# Patient Record
Sex: Male | Born: 1950
Health system: Southern US, Community
[De-identification: ages and names within clinical notes are randomized; demographics above are authoritative.]

## PROBLEM LIST (undated history)

## (undated) DIAGNOSIS — Z972 Presence of dental prosthetic device (complete) (partial): Secondary | ICD-10-CM

## (undated) DIAGNOSIS — I10 Essential (primary) hypertension: Secondary | ICD-10-CM

## (undated) DIAGNOSIS — M109 Gout, unspecified: Secondary | ICD-10-CM

## (undated) DIAGNOSIS — E119 Type 2 diabetes mellitus without complications: Secondary | ICD-10-CM

## (undated) DIAGNOSIS — E785 Hyperlipidemia, unspecified: Secondary | ICD-10-CM

## (undated) HISTORY — DX: Gout, unspecified: M10.9

## (undated) HISTORY — DX: Hyperlipidemia, unspecified: E78.5

## (undated) HISTORY — DX: Essential (primary) hypertension: I10

## (undated) HISTORY — PX: EYE SURGERY: SHX253

## (undated) HISTORY — PX: OTHER SURGICAL HISTORY: SHX169

## (undated) HISTORY — PX: JOINT REPLACEMENT: SHX530

---

## 2011-05-19 ENCOUNTER — Emergency Department: Payer: Self-pay | Admitting: Emergency Medicine

## 2011-11-14 ENCOUNTER — Ambulatory Visit: Payer: Self-pay | Admitting: Emergency Medicine

## 2012-06-20 LAB — HM COLONOSCOPY: HM Colonoscopy: NORMAL

## 2012-08-20 ENCOUNTER — Encounter: Payer: Self-pay | Admitting: Family Medicine

## 2012-08-20 ENCOUNTER — Ambulatory Visit (INDEPENDENT_AMBULATORY_CARE_PROVIDER_SITE_OTHER): Payer: PRIVATE HEALTH INSURANCE | Admitting: Family Medicine

## 2012-08-20 VITALS — BP 162/92 | HR 84 | Temp 98.1°F | Ht 67.5 in | Wt 186.0 lb

## 2012-08-20 DIAGNOSIS — Z125 Encounter for screening for malignant neoplasm of prostate: Secondary | ICD-10-CM

## 2012-08-20 DIAGNOSIS — M109 Gout, unspecified: Secondary | ICD-10-CM | POA: Insufficient documentation

## 2012-08-20 DIAGNOSIS — E785 Hyperlipidemia, unspecified: Secondary | ICD-10-CM

## 2012-08-20 DIAGNOSIS — I10 Essential (primary) hypertension: Secondary | ICD-10-CM

## 2012-08-20 LAB — COMPREHENSIVE METABOLIC PANEL
ALT: 55 U/L — ABNORMAL HIGH (ref 0–53)
AST: 44 U/L — ABNORMAL HIGH (ref 0–37)
Albumin: 4.3 g/dL (ref 3.5–5.2)
CO2: 29 mEq/L (ref 19–32)
Calcium: 9.9 mg/dL (ref 8.4–10.5)
Chloride: 99 mEq/L (ref 96–112)
Creatinine, Ser: 1 mg/dL (ref 0.4–1.5)
GFR: 85.58 mL/min (ref >60.00)
Potassium: 4.4 mEq/L (ref 3.5–5.1)

## 2012-08-20 LAB — PSA: PSA: 1.29 ng/mL (ref 0.10–4.00)

## 2012-08-20 LAB — LIPID PANEL: HDL: 55.4 mg/dL (ref >39.00)

## 2012-08-20 MED ORDER — LISINOPRIL-HYDROCHLOROTHIAZIDE 20-12.5 MG PO TABS: 1.0000 | ORAL_TABLET | Freq: Every day | ORAL | Status: DC

## 2012-08-20 MED ORDER — ALLOPURINOL 300 MG PO TABS: ORAL_TABLET | ORAL | Status: DC

## 2012-08-20 MED ORDER — SILDENAFIL CITRATE 50 MG PO TABS: 50.0000 mg | ORAL_TABLET | Freq: Every day | ORAL | Status: DC | PRN

## 2012-08-20 NOTE — Progress Notes (Signed)
Subjective:    Patient ID: David Turner, male    DOB: Oct 03, 1950, 61 y.o.   MRN: [id number]  HPI  61 yo here to establish care.  HTN- on lisinopril- HCTZ for years.  Checks BP at home and this am was 130/80.  Elevated here today but rushed to get here. No CP, SOB or blurred vision.  Gout- last flare was in right great toe years ago.  Has been on allopurinol since.  H/o HLD- was on lipitor previously.  Has not had cholesterol checked in awhile.  Never had a flu shot.  Colonoscopy normal this year.  Patient Active Problem List  Diagnosis  . Gout  . Hypertension  . Hyperlipidemia   Past Medical History  Diagnosis Date  . Gout   . Hypertension   . Hyperlipidemia    Past Surgical History  Procedure Date  . Left knee surgeries 1983 1993, 2008   History  Substance Use Topics  . Smoking status: Former Games developer  . Smokeless tobacco: Not on file  . Alcohol Use: Not on file   Family History  Problem Relation Age of Onset  . Heart disease Father   . Cancer Sister 6    colon   No Known Allergies Current Outpatient Prescriptions on File Prior to Visit  Medication Sig Dispense Refill  . allopurinol (ZYLOPRIM) 300 MG tablet Take by mouth daily.  90 tablet  3  . lisinopril-hydrochlorothiazide (PRINZIDE,ZESTORETIC) 20-12.5 MG per tablet Take 1 tablet by mouth daily.  90 tablet  6  . sildenafil (VIAGRA) 50 MG tablet Take 1 tablet (50 mg total) by mouth daily as needed for erectile dysfunction.  10 tablet  0   The PMH, PSH, Social History, Family History, Medications, and allergies have been reviewed in [facility], and have been updated if relevant.    Review of Systems See HPI Patient reports no  vision/ hearing changes,anorexia, weight change, fever ,adenopathy, persistant / recurrent hoarseness, swallowing issues, chest pain, edema,persistant / recurrent cough, hemoptysis, dyspnea(rest, exertional, paroxysmal nocturnal), gastrointestinal  bleeding (melena, rectal bleeding),  abdominal pain, excessive heart burn, GU symptoms(dysuria, hematuria, pyuria, voiding/incontinence  Issues) syncope, focal weakness, severe memory loss, concerning skin lesions, depression, anxiety, abnormal bruising/bleeding, major joint swelling.       Objective:   Physical Exam BP 162/92  Pulse 84  Temp 98.1 F (36.7 C)  Ht 5' 7.5" (1.715 m)  Wt 186 lb (84.369 kg)  BMI 28.70 kg/m2 General:  pleasant male in NAD Eyes:  PERRL Ears:  External ear exam shows no significant lesions or deformities.  Otoscopic examination reveals clear canals, tympanic membranes are intact bilaterally without bulging, retraction, inflammation or discharge. Hearing is grossly normal bilaterally. Nose:  External nasal examination shows no deformity or inflammation. Nasal mucosa are pink and moist without lesions or exudates. Mouth:  Oral mucosa and oropharynx without lesions or exudates.  Teeth in good repair. Neck:  no carotid bruit or thyromegaly no cervical or supraclavicular lymphadenopathy  Lungs:  Normal respiratory effort, chest expands symmetrically. Lungs are clear to auscultation, no crackles or wheezes. Heart:  Normal rate and regular rhythm. S1 and S2 normal without gallop, murmur, click, rub or other extra sounds. Abdomen:  Bowel sounds positive,abdomen soft and non-tender without masses, organomegaly or hernias noted. Genitalia:  Testes bilaterally descended without nodularity, tenderness or masses. No scrotal masses or lesions. No penis lesions or urethral discharge. Prostate:  Prostate gland firm and smooth, 1 plus enlargement, no nodularity, tenderness, mass, asymmetry or induration. Pulses:  R and L posterior tibial pulses are full and equal bilaterally  Extremities:  no edema         Assessment & Plan:   1. Hyperlipidemia  Does have risk factors for CAD- mother with CAD, h/o HTN. Former smoker. Will check lipid panel today. Lipid Panel  2. Hypertension  Deteriorated likely due to  white coat HTN. Stable at home on current meds- meds refilled. Recheck CMET today. Comprehensive metabolic panel  3. Gout  Stable on allopurinol.   4. Screening for prostate cancer  PSA

## 2012-08-20 NOTE — Patient Instructions (Addendum)
Great to meet you. We will call you with your lab results and to pick up your records.

## 2012-08-21 ENCOUNTER — Other Ambulatory Visit: Payer: Self-pay | Admitting: Family Medicine

## 2012-08-21 MED ORDER — ATORVASTATIN CALCIUM 20 MG PO TABS: 20.0000 mg | ORAL_TABLET | Freq: Every day | ORAL | Status: DC

## 2012-10-16 ENCOUNTER — Other Ambulatory Visit: Payer: Self-pay | Admitting: Family Medicine

## 2012-10-17 ENCOUNTER — Other Ambulatory Visit (INDEPENDENT_AMBULATORY_CARE_PROVIDER_SITE_OTHER): Payer: Managed Care, Other (non HMO)

## 2012-10-17 ENCOUNTER — Encounter: Payer: Self-pay | Admitting: *Deleted

## 2012-10-17 DIAGNOSIS — E785 Hyperlipidemia, unspecified: Secondary | ICD-10-CM

## 2012-10-17 DIAGNOSIS — I10 Essential (primary) hypertension: Secondary | ICD-10-CM

## 2012-10-17 LAB — LIPID PANEL
Cholesterol: 139 mg/dL (ref 0–200)
LDL Cholesterol: 67 mg/dL (ref 0–99)
Total CHOL/HDL Ratio: 2
VLDL: 15 mg/dL (ref 0.0–40.0)

## 2012-10-17 LAB — COMPREHENSIVE METABOLIC PANEL
ALT: 57 U/L — ABNORMAL HIGH (ref 0–53)
AST: 49 U/L — ABNORMAL HIGH (ref 0–37)
Albumin: 4.1 g/dL (ref 3.5–5.2)
Alkaline Phosphatase: 86 U/L (ref 39–117)
BUN: 13 mg/dL (ref 6–23)
Potassium: 4.4 mEq/L (ref 3.5–5.1)
Sodium: 138 mEq/L (ref 135–145)

## 2013-06-13 ENCOUNTER — Other Ambulatory Visit: Payer: Self-pay | Admitting: Family Medicine

## 2013-08-14 ENCOUNTER — Other Ambulatory Visit: Payer: Self-pay | Admitting: Family Medicine

## 2013-08-22 ENCOUNTER — Other Ambulatory Visit: Payer: Self-pay | Admitting: Family Medicine

## 2013-11-05 ENCOUNTER — Encounter: Payer: Self-pay | Admitting: Family Medicine

## 2013-11-05 ENCOUNTER — Ambulatory Visit (INDEPENDENT_AMBULATORY_CARE_PROVIDER_SITE_OTHER): Payer: Managed Care, Other (non HMO) | Admitting: Family Medicine

## 2013-11-05 VITALS — BP 128/76 | HR 74 | Temp 98.1°F | Ht 67.0 in | Wt 186.5 lb

## 2013-11-05 DIAGNOSIS — Z0000 Encounter for general adult medical examination without abnormal findings: Secondary | ICD-10-CM

## 2013-11-05 DIAGNOSIS — I10 Essential (primary) hypertension: Secondary | ICD-10-CM

## 2013-11-05 DIAGNOSIS — E785 Hyperlipidemia, unspecified: Secondary | ICD-10-CM

## 2013-11-05 DIAGNOSIS — Z125 Encounter for screening for malignant neoplasm of prostate: Secondary | ICD-10-CM

## 2013-11-05 DIAGNOSIS — M109 Gout, unspecified: Secondary | ICD-10-CM

## 2013-11-05 DIAGNOSIS — Z23 Encounter for immunization: Secondary | ICD-10-CM

## 2013-11-05 LAB — CBC WITH DIFFERENTIAL/PLATELET
BASOS PCT: 0.4 % (ref 0.0–3.0)
Basophils Absolute: 0 10*3/uL (ref 0.0–0.1)
EOS PCT: 1.9 % (ref 0.0–5.0)
Eosinophils Absolute: 0.1 10*3/uL (ref 0.0–0.7)
HEMATOCRIT: 47 % (ref 39.0–52.0)
Hemoglobin: 15.3 g/dL (ref 13.0–17.0)
LYMPHS ABS: 2.5 10*3/uL (ref 0.7–4.0)
Lymphocytes Relative: 36.6 % (ref 12.0–46.0)
MCHC: 32.5 g/dL (ref 30.0–36.0)
MCV: 87.1 fl (ref 78.0–100.0)
MONO ABS: 0.6 10*3/uL (ref 0.1–1.0)
Monocytes Relative: 8.4 % (ref 3.0–12.0)
Neutro Abs: 3.5 10*3/uL (ref 1.4–7.7)
Neutrophils Relative %: 52.7 % (ref 43.0–77.0)
PLATELETS: 272 10*3/uL (ref 150.0–400.0)
RBC: 5.4 Mil/uL (ref 4.22–5.81)
RDW: 14.9 % — ABNORMAL HIGH (ref 11.5–14.6)
WBC: 6.7 10*3/uL (ref 4.5–10.5)

## 2013-11-05 LAB — COMPREHENSIVE METABOLIC PANEL
ALT: 29 U/L (ref 0–53)
AST: 31 U/L (ref 0–37)
Albumin: 4 g/dL (ref 3.5–5.2)
Alkaline Phosphatase: 80 U/L (ref 39–117)
BUN: 15 mg/dL (ref 6–23)
CALCIUM: 9.8 mg/dL (ref 8.4–10.5)
CO2: 29 meq/L (ref 19–32)
CREATININE: 0.9 mg/dL (ref 0.4–1.5)
Chloride: 100 mEq/L (ref 96–112)
GFR: 90.73 mL/min (ref >60.00)
GLUCOSE: 108 mg/dL — AB (ref 70–99)
Potassium: 4.4 mEq/L (ref 3.5–5.1)
Sodium: 136 mEq/L (ref 135–145)
Total Bilirubin: 0.6 mg/dL (ref 0.3–1.2)
Total Protein: 7.3 g/dL (ref 6.0–8.3)

## 2013-11-05 LAB — LIPID PANEL
Cholesterol: 224 mg/dL — ABNORMAL HIGH (ref 0–200)
HDL: 61.1 mg/dL (ref >39.00)
Total CHOL/HDL Ratio: 4
Triglycerides: 93 mg/dL (ref 0.0–149.0)
VLDL: 18.6 mg/dL (ref 0.0–40.0)

## 2013-11-05 LAB — PSA: PSA: 1.43 ng/mL (ref 0.10–4.00)

## 2013-11-05 LAB — LDL CHOLESTEROL, DIRECT: Direct LDL: 141.6 mg/dL

## 2013-11-05 MED ORDER — LISINOPRIL-HYDROCHLOROTHIAZIDE 20-12.5 MG PO TABS: 1.0000 | ORAL_TABLET | Freq: Every day | ORAL | Status: DC

## 2013-11-05 MED ORDER — ALLOPURINOL 300 MG PO TABS: 300.0000 mg | ORAL_TABLET | Freq: Every day | ORAL | Status: DC

## 2013-11-05 MED ORDER — SILDENAFIL CITRATE 50 MG PO TABS: 50.0000 mg | ORAL_TABLET | ORAL | Status: DC | PRN

## 2013-11-05 NOTE — Assessment & Plan Note (Signed)
Does have risk factors for CAD- mother with CAD, h/o HTN. Former smoker. Will recheck labs today but likely does need to restart his statin.

## 2013-11-05 NOTE — Assessment & Plan Note (Signed)
Well controlled on current rx. No changes. 

## 2013-11-05 NOTE — Patient Instructions (Signed)
Let's check your labs today- we will call you with your results.

## 2013-11-05 NOTE — Addendum Note (Signed)
Addended by: Modena Nunnery on: 11/05/2013 10:28 AM   Modules accepted: Orders

## 2013-11-05 NOTE — Progress Notes (Signed)
Pre visit review using our clinic review tool, if applicable. No additional management support is needed unless otherwise documented below in the visit note. 

## 2013-11-05 NOTE — Progress Notes (Signed)
Subjective:    Patient ID: Calogero Geisen, male    DOB: [birth year], 63 y.o.   MRN: [id number]  HPI  63 yo here for med refill.  Has not been seen for routine care since 08/2012.  HTN- on lisinopril- HCTZ for years.  No CP, SOB or blurred vision.  Gout- last flare was in right great toe years ago.  Has been on allopurinol since.  H/o HLD- was on lipitor 20 mg daily but stopped taking it.  Started taking "something natural instead."  Has not had cholesterol checked since last year.  Lab Results  Component Value Date   CHOL 139 10/17/2012   HDL 57.50 10/17/2012   LDLCALC 67 10/17/2012   LDLDIRECT 160.6 08/20/2012   TRIG 75.0 10/17/2012   CHOLHDL 2 10/17/2012    Never had a flu shot.  He is interested in getting his shingles vaccine.  Colonoscopy UTD.  Patient Active Problem List   Diagnosis Date Noted  . Gout   . Hypertension   . Hyperlipidemia    Past Medical History  Diagnosis Date  . Gout   . Hypertension   . Hyperlipidemia    Past Surgical History  Procedure Laterality Date  . Left knee surgeries  1983 1993, 2008   History  Substance Use Topics  . Smoking status: Former Research scientist (life sciences)  . Smokeless tobacco: Not on file  . Alcohol Use: Not on file   Family History  Problem Relation Age of Onset  . Heart disease Father   . Cancer Sister 98    colon   No Known Allergies Current Outpatient Prescriptions on File Prior to Visit  Medication Sig Dispense Refill  . aspirin 81 MG tablet Take 81 mg by mouth daily.      Marland Kitchen atorvastatin (LIPITOR) 20 MG tablet TAKE 1 TABLET BY MOUTH EVERY DAY  30 tablet  0  . Multiple Vitamin (MULTIVITAMIN) tablet Take 1 tablet by mouth daily.      . Omega-3 Fatty Acids (FISH OIL) 1000 MG CAPS Take by mouth.       No current facility-administered medications on file prior to visit.   The PMH, PSH, Social History, Family History, Medications, and allergies have been reviewed in [facility], and have been updated if relevant.    Review of Systems See HPI        Objective:   Physical Exam BP 128/76  Pulse 74  Temp(Src) 98.1 F (36.7 C) (Oral)  Ht 5\' 7"  (1.702 m)  Wt 186 lb 8 oz (84.596 kg)  BMI 29.20 kg/m2  SpO2 97%  General:  pleasant male in NAD Eyes:  PERRL Ears:  External ear exam shows no significant lesions or deformities.  Otoscopic examination reveals clear canals, tympanic membranes are intact bilaterally without bulging, retraction, inflammation or discharge. Hearing is grossly normal bilaterally. Nose:  External nasal examination shows no deformity or inflammation. Nasal mucosa are pink and moist without lesions or exudates. Mouth:  Oral mucosa and oropharynx without lesions or exudates.  Teeth in good repair. Neck:  no carotid bruit or thyromegaly no cervical or supraclavicular lymphadenopathy  Lungs:  Normal respiratory effort, chest expands symmetrically. Lungs are clear to auscultation, no crackles or wheezes. Heart:  Normal rate and regular rhythm. S1 and S2 normal without gallop, murmur, click, rub or other extra sounds. Abdomen:  Bowel sounds positive,abdomen soft and non-tender without masses, organomegaly or hernias noted. Genitalia:  Testes bilaterally descended without nodularity, tenderness or masses. No scrotal masses or lesions. No penis lesions  or urethral discharge. Prostate:  Prostate gland firm and smooth, 1 plus enlargement, no nodularity, tenderness, mass, asymmetry or induration. Pulses:  R and L posterior tibial pulses are full and equal bilaterally  Extremities:  no edema         Assessment & Plan:

## 2013-11-07 ENCOUNTER — Telehealth: Payer: Self-pay | Admitting: Family Medicine

## 2013-11-07 ENCOUNTER — Encounter: Payer: Self-pay | Admitting: *Deleted

## 2013-11-07 NOTE — Telephone Encounter (Signed)
Relevant patient education assigned to patient using Emmi. ° °

## 2013-11-12 ENCOUNTER — Other Ambulatory Visit: Payer: Self-pay

## 2013-11-12 MED ORDER — ALLOPURINOL 300 MG PO TABS: 300.0000 mg | ORAL_TABLET | Freq: Every day | ORAL | Status: DC

## 2013-11-12 MED ORDER — LISINOPRIL-HYDROCHLOROTHIAZIDE 20-12.5 MG PO TABS: 1.0000 | ORAL_TABLET | Freq: Every day | ORAL | Status: DC

## 2013-11-12 NOTE — Telephone Encounter (Signed)
Pt was recently seen but did not get 90 day rx with refills to [facility]. Advised pt done.

## 2014-06-15 ENCOUNTER — Ambulatory Visit: Payer: Self-pay | Admitting: Ophthalmology

## 2014-06-16 ENCOUNTER — Other Ambulatory Visit: Payer: Self-pay | Admitting: *Deleted

## 2014-06-16 MED ORDER — ALLOPURINOL 300 MG PO TABS: 300.0000 mg | ORAL_TABLET | Freq: Every day | ORAL | Status: DC

## 2014-07-20 ENCOUNTER — Other Ambulatory Visit: Payer: Self-pay

## 2014-07-20 MED ORDER — ALLOPURINOL 300 MG PO TABS: 300.0000 mg | ORAL_TABLET | Freq: Every day | ORAL | Status: DC

## 2014-07-20 NOTE — Telephone Encounter (Signed)
[facility] left v/m requesting refill allopurinol.Please advise.

## 2014-08-17 ENCOUNTER — Other Ambulatory Visit: Payer: Self-pay | Admitting: *Deleted

## 2014-08-17 NOTE — Telephone Encounter (Signed)
Received faxed refill request from pharmacy. Last refill 07/20/14, note was sent to pharmacy that patient needs to be seen for further refills. No upcoming appointments scheduled. Is it okay to refill medication?

## 2014-08-17 NOTE — Telephone Encounter (Signed)
Yes.  One time only.

## 2014-08-18 MED ORDER — ALLOPURINOL 300 MG PO TABS: 300.0000 mg | ORAL_TABLET | Freq: Every day | ORAL | Status: DC

## 2014-08-18 MED ORDER — LISINOPRIL-HYDROCHLOROTHIAZIDE 20-12.5 MG PO TABS: 1.0000 | ORAL_TABLET | Freq: Every day | ORAL | Status: DC

## 2014-08-18 NOTE — Telephone Encounter (Signed)
Spoke to pt and f/u appt scheduled. Pt advised Rx to be sent, but he will be unable to receive any additional refills until seen

## 2014-08-26 ENCOUNTER — Encounter: Payer: Self-pay | Admitting: Family Medicine

## 2014-08-26 ENCOUNTER — Ambulatory Visit (INDEPENDENT_AMBULATORY_CARE_PROVIDER_SITE_OTHER): Payer: Private Health Insurance - Indemnity | Admitting: Family Medicine

## 2014-08-26 VITALS — BP 140/70 | HR 68 | Temp 98.0°F | Wt 188.0 lb

## 2014-08-26 DIAGNOSIS — E785 Hyperlipidemia, unspecified: Secondary | ICD-10-CM

## 2014-08-26 DIAGNOSIS — M10479 Other secondary gout, unspecified ankle and foot: Secondary | ICD-10-CM

## 2014-08-26 DIAGNOSIS — I10 Essential (primary) hypertension: Secondary | ICD-10-CM

## 2014-08-26 LAB — LIPID PANEL
Cholesterol: 224 mg/dL — ABNORMAL HIGH (ref 0–200)
HDL: 52.5 mg/dL (ref >39.00)
LDL Cholesterol: 152 mg/dL — ABNORMAL HIGH (ref 0–99)
NonHDL: 171.5
TRIGLYCERIDES: 98 mg/dL (ref 0.0–149.0)
Total CHOL/HDL Ratio: 4
VLDL: 19.6 mg/dL (ref 0.0–40.0)

## 2014-08-26 LAB — CBC WITH DIFFERENTIAL/PLATELET
BASOS ABS: 0 10*3/uL (ref 0.0–0.1)
BASOS PCT: 0.4 % (ref 0.0–3.0)
Eosinophils Absolute: 0.1 10*3/uL (ref 0.0–0.7)
Eosinophils Relative: 2 % (ref 0.0–5.0)
HEMATOCRIT: 45.1 % (ref 39.0–52.0)
HEMOGLOBIN: 14.8 g/dL (ref 13.0–17.0)
LYMPHS ABS: 2.5 10*3/uL (ref 0.7–4.0)
LYMPHS PCT: 35.9 % (ref 12.0–46.0)
MCHC: 32.7 g/dL (ref 30.0–36.0)
MCV: 86.5 fl (ref 78.0–100.0)
MONOS PCT: 9.1 % (ref 3.0–12.0)
Monocytes Absolute: 0.6 10*3/uL (ref 0.1–1.0)
NEUTROS ABS: 3.7 10*3/uL (ref 1.4–7.7)
Neutrophils Relative %: 52.6 % (ref 43.0–77.0)
Platelets: 253 10*3/uL (ref 150.0–400.0)
RBC: 5.22 Mil/uL (ref 4.22–5.81)
RDW: 15.1 % (ref 11.5–15.5)
WBC: 7 10*3/uL (ref 4.0–10.5)

## 2014-08-26 LAB — COMPREHENSIVE METABOLIC PANEL
ALT: 32 U/L (ref 0–53)
AST: 32 U/L (ref 0–37)
Albumin: 4 g/dL (ref 3.5–5.2)
Alkaline Phosphatase: 76 U/L (ref 39–117)
BILIRUBIN TOTAL: 0.7 mg/dL (ref 0.2–1.2)
BUN: 14 mg/dL (ref 6–23)
CALCIUM: 9.1 mg/dL (ref 8.4–10.5)
CHLORIDE: 103 meq/L (ref 96–112)
CO2: 28 mEq/L (ref 19–32)
CREATININE: 0.9 mg/dL (ref 0.4–1.5)
GFR: 87.14 mL/min (ref >60.00)
Glucose, Bld: 113 mg/dL — ABNORMAL HIGH (ref 70–99)
Potassium: 4.2 mEq/L (ref 3.5–5.1)
Sodium: 137 mEq/L (ref 135–145)
Total Protein: 7.1 g/dL (ref 6.0–8.3)

## 2014-08-26 MED ORDER — ALLOPURINOL 300 MG PO TABS: 300.0000 mg | ORAL_TABLET | Freq: Every day | ORAL | Status: DC

## 2014-08-26 MED ORDER — LISINOPRIL-HYDROCHLOROTHIAZIDE 20-12.5 MG PO TABS: 1.0000 | ORAL_TABLET | Freq: Every day | ORAL | Status: DC

## 2014-08-26 NOTE — Assessment & Plan Note (Signed)
Due for repeat labs. He is aware that I may suggest he restart statin.

## 2014-08-26 NOTE — Assessment & Plan Note (Signed)
No recent flares. Allopurinol refilled.

## 2014-08-26 NOTE — Assessment & Plan Note (Signed)
Well controlled on current rx. eRx refilled today. Will check labs.

## 2014-08-26 NOTE — Progress Notes (Signed)
Pre visit review using our clinic review tool, if applicable. No additional management support is needed unless otherwise documented below in the visit note. 

## 2014-08-26 NOTE — Progress Notes (Signed)
Subjective:    Patient ID: David Turner, male    DOB: [birth year], 63 y.o.   MRN: [id number]  HPI  63 yo pleasant male here for med refill.   Doing well- officially relocating here- he and his wife have been in between [place] and [place]. Looking forward to it.  HTN- on lisinopril- HCTZ for years.  No CP, SOB or blurred vision. Lab Results  Component Value Date   CREATININE 0.9 11/05/2013    Gout- last flare was in right great toe years ago.  Has been on allopurinol since.   H/o HLD- was on lipitor 20 mg daily but stopped taking it.  Started taking "something natural instead." In February, LDL was borderline high.  Has gained a few pounds, eating "Dibble food." Wt Readings from Last 3 Encounters:  08/26/14 188 lb (85.276 kg)  11/05/13 186 lb 8 oz (84.596 kg)  08/20/12 186 lb (84.369 kg)    Lab Results  Component Value Date   CHOL 224* 11/05/2013   HDL 61.10 11/05/2013   LDLCALC 67 10/17/2012   LDLDIRECT 141.6 11/05/2013   TRIG 93.0 11/05/2013   CHOLHDL 4 11/05/2013     Patient Active Problem List   Diagnosis Date Noted  . Gout   . Hypertension   . Hyperlipidemia    Past Medical History  Diagnosis Date  . Gout   . Hypertension   . Hyperlipidemia    Past Surgical History  Procedure Laterality Date  . Left knee surgeries  1983 1993, 2008   History  Substance Use Topics  . Smoking status: Former Research scientist (life sciences)  . Smokeless tobacco: Not on file  . Alcohol Use: Not on file   Family History  Problem Relation Age of Onset  . Heart disease Father   . Cancer Sister 14    colon   No Known Allergies Current Outpatient Prescriptions on File Prior to Visit  Medication Sig Dispense Refill  . allopurinol (ZYLOPRIM) 300 MG tablet Take 1 tablet (300 mg total) by mouth daily. 30 tablet 0  . aspirin 81 MG tablet Take 81 mg by mouth daily.    Marland Kitchen lisinopril-hydrochlorothiazide (PRINZIDE,ZESTORETIC) 20-12.5 MG per tablet Take 1 tablet by mouth daily. 30 tablet 0  . Multiple Vitamin  (MULTIVITAMIN) tablet Take 1 tablet by mouth daily.    . Omega-3 Fatty Acids (FISH OIL) 1000 MG CAPS Take by mouth.    . sildenafil (VIAGRA) 50 MG tablet Take 1 tablet (50 mg total) by mouth as needed for erectile dysfunction. 10 tablet 1   No current facility-administered medications on file prior to visit.   The PMH, PSH, Social History, Family History, Medications, and allergies have been reviewed in [facility], and have been updated if relevant.    Review of Systems  Constitutional: Negative.   HENT: Negative.   Eyes: Negative.   Respiratory: Negative.   Cardiovascular: Negative.   Gastrointestinal: Negative.   Genitourinary: Negative.   Musculoskeletal: Negative.   Skin: Negative.   Psychiatric/Behavioral: Negative.   All other systems reviewed and are negative.  See HPI       Objective:   Physical Exam BP 140/70 mmHg  Pulse 68  Temp(Src) 98 F (36.7 C) (Tympanic)  Wt 188 lb (85.276 kg)  SpO2 97%  . Wt Readings from Last 3 Encounters:  08/26/14 188 lb (85.276 kg)  11/05/13 186 lb 8 oz (84.596 kg)  08/20/12 186 lb (84.369 kg)     General:  pleasant male in NAD Eyes:  PERRL Ears:  External ear exam shows no significant lesions or deformities.  Otoscopic examination reveals clear canals, tympanic membranes are intact bilaterally without bulging, retraction, inflammation or discharge. Hearing is grossly normal bilaterally. Nose:  External nasal examination shows no deformity or inflammation. Nasal mucosa are pink and moist without lesions or exudates. Mouth:  Oral mucosa and oropharynx without lesions or exudates.  Teeth in good repair. Neck:  no carotid bruit or thyromegaly no cervical or supraclavicular lymphadenopathy  Lungs:  Normal respiratory effort, chest expands symmetrically. Lungs are clear to auscultation, no crackles or wheezes. Heart:  Normal rate and regular rhythm. S1 and S2 normal without gallop, murmur, click, rub or other extra sounds. Abdomen:  Bowel  sounds positive,abdomen soft and non-tender without masses, organomegaly or hernias noted.. Pulses:  R and L posterior tibial pulses are full and equal bilaterally  Extremities:  no edema  Psych:  Good eye contact, not anxious or depressed appearing        Assessment & Plan:

## 2014-08-26 NOTE — Patient Instructions (Signed)
Good to see you. Happy Holidays. We will call you with your lab results.

## 2014-08-31 ENCOUNTER — Other Ambulatory Visit: Payer: Self-pay | Admitting: Family Medicine

## 2014-08-31 MED ORDER — ATORVASTATIN CALCIUM 20 MG PO TABS: 20.0000 mg | ORAL_TABLET | Freq: Every day | ORAL | Status: DC

## 2014-10-01 ENCOUNTER — Other Ambulatory Visit: Payer: Self-pay | Admitting: Family Medicine

## 2014-10-01 DIAGNOSIS — E785 Hyperlipidemia, unspecified: Secondary | ICD-10-CM

## 2014-10-06 ENCOUNTER — Other Ambulatory Visit (INDEPENDENT_AMBULATORY_CARE_PROVIDER_SITE_OTHER): Payer: Private Health Insurance - Indemnity

## 2014-10-06 DIAGNOSIS — E785 Hyperlipidemia, unspecified: Secondary | ICD-10-CM

## 2014-10-06 LAB — HEPATIC FUNCTION PANEL
ALT: 41 U/L (ref 0–53)
AST: 36 U/L (ref 0–37)
Albumin: 4.2 g/dL (ref 3.5–5.2)
Alkaline Phosphatase: 90 U/L (ref 39–117)
Bilirubin, Direct: 0.2 mg/dL (ref 0.0–0.3)
Total Bilirubin: 0.6 mg/dL (ref 0.2–1.2)
Total Protein: 7.2 g/dL (ref 6.0–8.3)

## 2014-10-06 LAB — LIPID PANEL
CHOLESTEROL: 136 mg/dL (ref 0–200)
HDL: 66.4 mg/dL (ref >39.00)
LDL CALC: 54 mg/dL (ref 0–99)
NonHDL: 69.6
TRIGLYCERIDES: 76 mg/dL (ref 0.0–149.0)
Total CHOL/HDL Ratio: 2
VLDL: 15.2 mg/dL (ref 0.0–40.0)

## 2014-10-07 ENCOUNTER — Encounter: Payer: Self-pay | Admitting: *Deleted

## 2014-11-27 ENCOUNTER — Ambulatory Visit (INDEPENDENT_AMBULATORY_CARE_PROVIDER_SITE_OTHER): Payer: Managed Care, Other (non HMO) | Admitting: Primary Care

## 2014-11-27 ENCOUNTER — Encounter: Payer: Self-pay | Admitting: Primary Care

## 2014-11-27 VITALS — BP 142/92 | HR 80 | Temp 98.5°F | Ht 67.0 in | Wt 187.8 lb

## 2014-11-27 DIAGNOSIS — R05 Cough: Secondary | ICD-10-CM | POA: Insufficient documentation

## 2014-11-27 DIAGNOSIS — R059 Cough, unspecified: Secondary | ICD-10-CM | POA: Insufficient documentation

## 2014-11-27 MED ORDER — AZITHROMYCIN 250 MG PO TABS: ORAL_TABLET | ORAL | Status: DC

## 2014-11-27 MED ORDER — HYDROCODONE-HOMATROPINE 5-1.5 MG/5ML PO SYRP: 5.0000 mL | ORAL_SOLUTION | Freq: Three times a day (TID) | ORAL | Status: DC | PRN

## 2014-11-27 NOTE — Progress Notes (Signed)
Subjective:    Patient ID: David Turner, male    DOB: 1951-08-30, 64 y.o.   MRN: [id number]  HPI  David Turner is a 64 year old male who presents today with a chief complaint of cough. The cough started on 3/8 just as he was traveling to [place]. He took a cough suppressant (cannot recall the name) OTC, ran out, and then starting taking Robitussin. The cough has worsened and has become productive with yellow/green mucous. He'll be traveling next week to [place] to visit his grandchildren and is afraid to be sick around them. Nothing has helped to alleviate his cough and nothing in particular makes his cough worse.   Review of Systems  Constitutional: Negative for fever and chills.  HENT: Positive for postnasal drip, sinus pressure and sore throat. Negative for ear pain and rhinorrhea.   Eyes: Negative for itching.  Respiratory: Positive for cough. Negative for shortness of breath.   Cardiovascular: Negative for chest pain.  Gastrointestinal: Negative for nausea and vomiting.  Musculoskeletal: Negative for myalgias.  Neurological: Negative for dizziness and headaches.  Hematological: Negative for adenopathy.       Past Medical History  Diagnosis Date  . Gout   . Hypertension   . Hyperlipidemia     History   Social History  . Marital Status: Married    Spouse Name: N/A  . Number of Children: N/A  . Years of Education: N/A   Occupational History  . Not on file.   Social History Main Topics  . Smoking status: Former Research scientist (life sciences)  . Smokeless tobacco: Not on file  . Alcohol Use: 0.0 oz/week    0 Standard drinks or equivalent per week  . Drug Use: Not on file  . Sexual Activity: Not on file   Other Topics Concern  . Not on file   Social History Narrative   Married.   Travels to and from [place] often- son still lives there.    Past Surgical History  Procedure Laterality Date  . Left knee surgeries  1983 1993, 2008    Family History  Problem Relation Age of Onset    . Heart disease Father   . Cancer Sister 45    colon    No Known Allergies  Current Outpatient Prescriptions on File Prior to Visit  Medication Sig Dispense Refill  . allopurinol (ZYLOPRIM) 300 MG tablet Take 1 tablet (300 mg total) by mouth daily. 90 tablet 3  . aspirin 81 MG tablet Take 81 mg by mouth daily.    Marland Kitchen atorvastatin (LIPITOR) 20 MG tablet Take 1 tablet (20 mg total) by mouth daily. 90 tablet 3  . lisinopril-hydrochlorothiazide (PRINZIDE,ZESTORETIC) 20-12.5 MG per tablet Take 1 tablet by mouth daily. 90 tablet 3  . Multiple Vitamin (MULTIVITAMIN) tablet Take 1 tablet by mouth daily.    . Omega-3 Fatty Acids (FISH OIL) 1000 MG CAPS Take by mouth.     No current facility-administered medications on file prior to visit.    BP 142/92 mmHg  Pulse 80  Temp(Src) 98.5 F (36.9 C) (Oral)  Ht 5\' 7"  (1.702 m)  Wt 187 lb 12.8 oz (85.186 kg)  BMI 29.41 kg/m2  SpO2 95%    Objective:   Physical Exam  Constitutional: He is oriented to person, place, and time. He appears well-developed.  HENT:  Head: Normocephalic.  Right Ear: External ear normal.  Left Ear: External ear normal.  Nose: Nose normal.  Mouth/Throat: Oropharynx is clear and moist.  Eyes: Conjunctivae  are normal. Pupils are equal, round, and reactive to light.  Neck: Neck supple.  Cardiovascular: Normal rate and regular rhythm.   Pulmonary/Chest: Effort normal and breath sounds normal. He has no wheezes. He has no rales.  Lymphadenopathy:    He has no cervical adenopathy.  Neurological: He is alert and oriented to person, place, and time.  Skin: Skin is warm and dry.  Psychiatric: He has a normal mood and affect.          Assessment & Plan:

## 2014-11-27 NOTE — Assessment & Plan Note (Signed)
Lung clear, do not suspect pneumonia; however, due to recent travel and worsening symptoms over a 10 day course, treated with Z-Pak. Also provided RX for Hycodan with instructions explaining effects of drowsiness. Follow up in 3-4 days if no improvement.

## 2014-11-27 NOTE — Progress Notes (Signed)
Pre visit review using our clinic review tool, if applicable. No additional management support is needed unless otherwise documented below in the visit note. 

## 2014-11-27 NOTE — Patient Instructions (Signed)
Start Z-Pak antibiotic. Take 2 tablets by mouth today, then 1 tablet by mouth daily for four more days. Take Hycodan cough syrup three times daily as needed. This may make you drowsy, so do not drive when taking this. Safe travels and I hope you feel better soon!

## 2015-01-02 NOTE — Op Note (Signed)
PATIENT NAME:  David Turner, David Turner MR#:  [id number] DATE OF BIRTH:  Jan 20, 1951  DATE OF PROCEDURE:  06/15/2014  PREOPERATIVE DIAGNOSIS: Cataract, right eye.   POSTOPERATIVE DIAGNOSIS: Cataract, right eye.   PROCEDURE PERFORMED: Extracapsular cataract extraction using phacoemulsification with placement of an Alcon [id number] 19.0 diopter posterior chamber lens, serial number [id number].   SURGEON: Loura Back. , M.D.   ANESTHESIA: 4% lidocaine and 0.75% Marcaine, a 50-50 mixture with 10 units/mL of Hylenex added, given as a peribulbar.   ANESTHESIOLOGIST: Dr. Kayleen Memos.   COMPLICATIONS: None.   ESTIMATED BLOOD LOSS: Less than 1 mL.   DESCRIPTION OF PROCEDURE:  The patient was brought to the operating room and given a peribulbar block.  The patient was then prepped and draped in the usual fashion.  The vertical rectus muscles were imbricated using 5-0 silk sutures.  These sutures were then clamped to the sterile drapes as bridle sutures.  A limbal peritomy was performed extending two clock hours and hemostasis was obtained with cautery.  A partial thickness scleral groove was made at the surgical limbus and dissected anteriorly in a lamellar dissection using an Alcon crescent knife.  The anterior chamber was entered superonasally with a Superblade and through the lamellar dissection with a 2.6 mm keratome.  DisCoVisc was used to replace the aqueous and a continuous tear capsulorrhexis was carried out.  Hydrodissection and hydrodelineation were carried out with balanced salt and a 27 gauge canula.  The nucleus was rotated to confirm the effectiveness of the hydrodissection.  Phacoemulsification was carried out using a divide-and-conquer technique.  Total ultrasound time was 1 minute and 11 seconds with an average power of 26 percent. CDE of 34.58.  No suture was placed.  Irrigation/aspiration was used to remove the residual cortex.  DisCoVisc was used to inflate the capsule and the internal  incision was enlarged to 3 mm with the crescent knife.  The intraocular lens was folded and inserted into the capsular bag using the AcrySert delivery system.  Irrigation/aspiration was used to remove the residual DisCoVisc.  Miostat was injected into the anterior chamber through the paracentesis track to inflate the anterior chamber and induce miosis, and 0.1 mL of cefuroxime containing 1 mg of drug was injected via the paracentesis tract. The wound was checked for leaks and none were found. The conjunctiva was closed with cautery and the bridle sutures were removed.  Two drops of 0.3% Vigamox were placed on the eye.   An eye shield was placed on the eye.  The patient was discharged to the recovery room in good condition.   ____________________________ Loura Back , MD sad:JT D: 06/15/2014 09:40:16 ET T: 06/15/2014 11:39:32 ET JOB#: [id number]  cc: Remo Lipps A. , MD, <Dictator> Martie Lee MD ELECTRONICALLY SIGNED 06/15/2014 12:12

## 2015-08-03 ENCOUNTER — Other Ambulatory Visit: Payer: Self-pay | Admitting: Family Medicine

## 2015-09-29 ENCOUNTER — Ambulatory Visit (INDEPENDENT_AMBULATORY_CARE_PROVIDER_SITE_OTHER): Payer: Managed Care, Other (non HMO) | Admitting: Family Medicine

## 2015-09-29 ENCOUNTER — Encounter: Payer: Self-pay | Admitting: Family Medicine

## 2015-09-29 VITALS — BP 132/66 | HR 70 | Temp 98.2°F | Ht 67.25 in | Wt 190.0 lb

## 2015-09-29 DIAGNOSIS — I10 Essential (primary) hypertension: Secondary | ICD-10-CM | POA: Diagnosis not present

## 2015-09-29 DIAGNOSIS — Z1159 Encounter for screening for other viral diseases: Secondary | ICD-10-CM

## 2015-09-29 DIAGNOSIS — M10479 Other secondary gout, unspecified ankle and foot: Secondary | ICD-10-CM | POA: Diagnosis not present

## 2015-09-29 DIAGNOSIS — E785 Hyperlipidemia, unspecified: Secondary | ICD-10-CM

## 2015-09-29 DIAGNOSIS — Z0000 Encounter for general adult medical examination without abnormal findings: Secondary | ICD-10-CM

## 2015-09-29 LAB — CBC WITH DIFFERENTIAL/PLATELET
BASOS PCT: 0.4 % (ref 0.0–3.0)
Basophils Absolute: 0 10*3/uL (ref 0.0–0.1)
EOS PCT: 2.4 % (ref 0.0–5.0)
Eosinophils Absolute: 0.2 10*3/uL (ref 0.0–0.7)
HCT: 43.6 % (ref 39.0–52.0)
HEMOGLOBIN: 14.3 g/dL (ref 13.0–17.0)
Lymphocytes Relative: 33.7 % (ref 12.0–46.0)
Lymphs Abs: 2.3 10*3/uL (ref 0.7–4.0)
MCHC: 32.9 g/dL (ref 30.0–36.0)
MCV: 84.9 fl (ref 78.0–100.0)
MONO ABS: 0.4 10*3/uL (ref 0.1–1.0)
Monocytes Relative: 6.2 % (ref 3.0–12.0)
NEUTROS ABS: 4 10*3/uL (ref 1.4–7.7)
Neutrophils Relative %: 57.3 % (ref 43.0–77.0)
Platelets: 260 10*3/uL (ref 150.0–400.0)
RBC: 5.14 Mil/uL (ref 4.22–5.81)
RDW: 15.3 % (ref 11.5–15.5)
WBC: 6.9 10*3/uL (ref 4.0–10.5)

## 2015-09-29 LAB — COMPREHENSIVE METABOLIC PANEL
ALBUMIN: 4.4 g/dL (ref 3.5–5.2)
ALT: 37 U/L (ref 0–53)
AST: 29 U/L (ref 0–37)
Alkaline Phosphatase: 88 U/L (ref 39–117)
BUN: 15 mg/dL (ref 6–23)
CHLORIDE: 102 meq/L (ref 96–112)
CO2: 26 mEq/L (ref 19–32)
Calcium: 9.8 mg/dL (ref 8.4–10.5)
Creatinine, Ser: 0.84 mg/dL (ref 0.40–1.50)
GFR: 97.66 mL/min (ref >60.00)
Glucose, Bld: 118 mg/dL — ABNORMAL HIGH (ref 70–99)
POTASSIUM: 4.4 meq/L (ref 3.5–5.1)
SODIUM: 140 meq/L (ref 135–145)
Total Bilirubin: 0.5 mg/dL (ref 0.2–1.2)
Total Protein: 7.4 g/dL (ref 6.0–8.3)

## 2015-09-29 LAB — LIPID PANEL
CHOL/HDL RATIO: 3
CHOLESTEROL: 158 mg/dL (ref 0–200)
HDL: 55.3 mg/dL (ref >39.00)
LDL Cholesterol: 90 mg/dL (ref 0–99)
NONHDL: 102.79
Triglycerides: 64 mg/dL (ref 0.0–149.0)
VLDL: 12.8 mg/dL (ref 0.0–40.0)

## 2015-09-29 LAB — PSA: PSA: 1.36 ng/mL (ref 0.10–4.00)

## 2015-09-29 MED ORDER — ALLOPURINOL 300 MG PO TABS: 300.0000 mg | ORAL_TABLET | Freq: Every day | ORAL | Status: DC

## 2015-09-29 MED ORDER — LISINOPRIL-HYDROCHLOROTHIAZIDE 20-12.5 MG PO TABS: 1.0000 | ORAL_TABLET | Freq: Every day | ORAL | Status: DC

## 2015-09-29 NOTE — Assessment & Plan Note (Signed)
Well controlled. No changes made today. 

## 2015-09-29 NOTE — Addendum Note (Signed)
Addended by: Daralene Milch C on: 09/29/2015 11:19 AM   Modules accepted: SmartSet

## 2015-09-29 NOTE — Assessment & Plan Note (Signed)
Due for labs. Continue current dose of lipitor for now.

## 2015-09-29 NOTE — Progress Notes (Signed)
Subjective:    Patient ID: David Turner, male    DOB: 04-27-1951, 65 y.o.   MRN: [id number]  HPI  65 yo pleasant male here for CPX and follow up of chronic medical conditions.   Colonoscopy 06/20/2012   HTN- on lisinopril- HCTZ for years.  No CP, SOB or blurred vision. Lab Results  Component Value Date   CREATININE 0.9 08/26/2014    Gout- last flare was in right great toe years ago.  Has been on allopurinol since.   H/o HLD- currently taking lipitor 20 mg daily. Wt Readings from Last 3 Encounters:  09/29/15 190 lb (86.183 kg)  11/27/14 187 lb 12.8 oz (85.186 kg)  08/26/14 188 lb (85.276 kg)    Lab Results  Component Value Date   CHOL 136 10/06/2014   HDL 66.40 10/06/2014   LDLCALC 54 10/06/2014   LDLDIRECT 141.6 11/05/2013   TRIG 76.0 10/06/2014   CHOLHDL 2 10/06/2014     Patient Active Problem List   Diagnosis Date Noted  . Visit for well man health check 09/29/2015  . Gout   . Hypertension   . Hyperlipidemia    Past Medical History  Diagnosis Date  . Gout   . Hypertension   . Hyperlipidemia    Past Surgical History  Procedure Laterality Date  . Left knee surgeries  1983 1993, 2008   Social History  Substance Use Topics  . Smoking status: Former Research scientist (life sciences)  . Smokeless tobacco: None  . Alcohol Use: 0.0 oz/week    0 Standard drinks or equivalent per week   Family History  Problem Relation Age of Onset  . Heart disease Father   . Cancer Sister 65    colon   No Known Allergies Current Outpatient Prescriptions on File Prior to Visit  Medication Sig Dispense Refill  . aspirin 81 MG tablet Take 81 mg by mouth daily.    Marland Kitchen atorvastatin (LIPITOR) 20 MG tablet TAKE 1 TABLET (20 MG TOTAL) BY MOUTH DAILY. 90 tablet 0  . Multiple Vitamin (MULTIVITAMIN) tablet Take 1 tablet by mouth daily.    . Omega-3 Fatty Acids (FISH OIL) 1000 MG CAPS Take by mouth.     No current facility-administered medications on file prior to visit.   The PMH, PSH, Social History,  Family History, Medications, and allergies have been reviewed in [facility], and have been updated if relevant.    Review of Systems  Constitutional: Negative.   HENT: Negative.   Eyes: Negative.   Respiratory: Negative.   Cardiovascular: Negative.   Gastrointestinal: Negative.   Genitourinary: Negative.   Musculoskeletal: Negative.   Skin: Negative.   Hematological: Negative.   Psychiatric/Behavioral: Negative.   All other systems reviewed and are negative.  See HPI       Objective:   Physical Exam BP 132/66 mmHg  Pulse 70  Temp(Src) 98.2 F (36.8 C) (Oral)  Ht 5' 7.25" (1.708 m)  Wt 190 lb (86.183 kg)  BMI 29.54 kg/m2  SpO2 94%  . Wt Readings from Last 3 Encounters:  09/29/15 190 lb (86.183 kg)  11/27/14 187 lb 12.8 oz (85.186 kg)  08/26/14 188 lb (85.276 kg)     General:  pleasant male in NAD Eyes:  PERRL Ears:  External ear exam shows no significant lesions or deformities.  Otoscopic examination reveals clear canals, tympanic membranes are intact bilaterally without bulging, retraction, inflammation or discharge. Hearing is grossly normal bilaterally. Nose:  External nasal examination shows no deformity or inflammation. Nasal mucosa are pink  and moist without lesions or exudates. Mouth:  Oral mucosa and oropharynx without lesions or exudates.  Teeth in good repair. Neck:  no carotid bruit or thyromegaly no cervical or supraclavicular lymphadenopathy  Lungs:  Normal respiratory effort, chest expands symmetrically. Lungs are clear to auscultation, no crackles or wheezes. Heart:  Normal rate and regular rhythm. S1 and S2 normal without gallop, murmur, click, rub or other extra sounds. Abdomen:  Bowel sounds positive,abdomen soft and non-tender without masses, organomegaly or hernias noted.. Pulses:  R and L posterior tibial pulses are full and equal bilaterally  Extremities:  no edema  Psych:  Good eye contact, not anxious or depressed appearing        Assessment &  Plan:

## 2015-09-29 NOTE — Patient Instructions (Signed)
Great to see you.  We will call you with your lab results. 

## 2015-09-29 NOTE — Assessment & Plan Note (Addendum)
Reviewed preventive care protocols, scheduled due services, and updated immunizations Discussed nutrition, exercise, diet, and healthy lifestyle.  Orders Placed This Encounter  Procedures  . CBC with Differential/Platelet  . Comprehensive metabolic panel  . Lipid panel  . PSA  . Hepatitis C Antibody  . HIV antibody (with reflex)

## 2015-09-30 LAB — HIV ANTIBODY (ROUTINE TESTING W REFLEX): HIV: NONREACTIVE

## 2015-09-30 LAB — HEPATITIS C ANTIBODY: HCV Ab: NEGATIVE

## 2015-10-01 ENCOUNTER — Encounter: Payer: Self-pay | Admitting: *Deleted

## 2015-10-31 ENCOUNTER — Other Ambulatory Visit: Payer: Self-pay | Admitting: Family Medicine

## 2016-09-01 DIAGNOSIS — H40003 Preglaucoma, unspecified, bilateral: Secondary | ICD-10-CM | POA: Diagnosis not present

## 2016-09-14 ENCOUNTER — Ambulatory Visit (INDEPENDENT_AMBULATORY_CARE_PROVIDER_SITE_OTHER): Payer: Medicare Other | Admitting: Family Medicine

## 2016-09-14 ENCOUNTER — Encounter: Payer: Self-pay | Admitting: Family Medicine

## 2016-09-14 VITALS — BP 146/82 | HR 75 | Temp 98.3°F | Wt 194.8 lb

## 2016-09-14 DIAGNOSIS — M10479 Other secondary gout, unspecified ankle and foot: Secondary | ICD-10-CM | POA: Diagnosis not present

## 2016-09-14 DIAGNOSIS — E785 Hyperlipidemia, unspecified: Secondary | ICD-10-CM | POA: Diagnosis not present

## 2016-09-14 DIAGNOSIS — I10 Essential (primary) hypertension: Secondary | ICD-10-CM | POA: Diagnosis not present

## 2016-09-14 LAB — COMPREHENSIVE METABOLIC PANEL
ALK PHOS: 83 U/L (ref 39–117)
ALT: 30 U/L (ref 0–53)
AST: 24 U/L (ref 0–37)
Albumin: 4.4 g/dL (ref 3.5–5.2)
BUN: 12 mg/dL (ref 6–23)
CHLORIDE: 102 meq/L (ref 96–112)
CO2: 29 meq/L (ref 19–32)
Calcium: 9.5 mg/dL (ref 8.4–10.5)
Creatinine, Ser: 0.87 mg/dL (ref 0.40–1.50)
GFR: 93.5 mL/min (ref >60.00)
GLUCOSE: 116 mg/dL — AB (ref 70–99)
POTASSIUM: 4 meq/L (ref 3.5–5.1)
Sodium: 138 mEq/L (ref 135–145)
Total Bilirubin: 0.3 mg/dL (ref 0.2–1.2)
Total Protein: 7.4 g/dL (ref 6.0–8.3)

## 2016-09-14 LAB — LIPID PANEL
CHOL/HDL RATIO: 4
Cholesterol: 220 mg/dL — ABNORMAL HIGH (ref 0–200)
HDL: 50.7 mg/dL (ref >39.00)
LDL CALC: 133 mg/dL — AB (ref 0–99)
NONHDL: 168.98
Triglycerides: 182 mg/dL — ABNORMAL HIGH (ref 0.0–149.0)
VLDL: 36.4 mg/dL (ref 0.0–40.0)

## 2016-09-14 MED ORDER — LISINOPRIL-HYDROCHLOROTHIAZIDE 20-12.5 MG PO TABS: 1.0000 | ORAL_TABLET | Freq: Every day | ORAL | 1 refills | Status: DC

## 2016-09-14 MED ORDER — ALLOPURINOL 300 MG PO TABS: 300.0000 mg | ORAL_TABLET | Freq: Every day | ORAL | 1 refills | Status: DC

## 2016-09-14 NOTE — Assessment & Plan Note (Addendum)
No currently taking lipitor. Check labs today.

## 2016-09-14 NOTE — Assessment & Plan Note (Signed)
No recent flares, allopurinol refilled.

## 2016-09-14 NOTE — Progress Notes (Signed)
Pre visit review using our clinic review tool, if applicable. No additional management support is needed unless otherwise documented below in the visit note. 

## 2016-09-14 NOTE — Assessment & Plan Note (Signed)
Reasonable control. No changes made today. 

## 2016-09-14 NOTE — Patient Instructions (Signed)
Great to see you! Happy New year! We will call you with your results and you can view them online.

## 2016-09-14 NOTE — Progress Notes (Addendum)
Subjective:    Patient ID: David Turner, male    DOB: 1950/09/30, 66 y.o.   MRN: [id number]  HPI  66 yo pleasant male here for  follow up of chronic medical conditions.    HTN- on lisinopril- HCTZ for years.  No CP, SOB or blurred vision. Lab Results  Component Value Date   CREATININE 0.84 09/29/2015   Gout- last flare was in right great toe years ago.  Has been on allopurinol since.  H/o HLD- stop taking lipitor 20 mg daily.  Due for labs.  Denies myalgias. Wt Readings from Last 3 Encounters:  09/14/16 194 lb 12 oz (88.3 kg)  09/29/15 190 lb (86.2 kg)  11/27/14 187 lb 12.8 oz (85.2 kg)    Lab Results  Component Value Date   CHOL 158 09/29/2015   HDL 55.30 09/29/2015   LDLCALC 90 09/29/2015   LDLDIRECT 141.6 11/05/2013   TRIG 64.0 09/29/2015   CHOLHDL 3 09/29/2015   Lab Results  Component Value Date   ALT 37 09/29/2015   AST 29 09/29/2015   ALKPHOS 88 09/29/2015   BILITOT 0.5 09/29/2015     Patient Active Problem List  Diagnosis  . Gout  . Hypertension  . Hyperlipidemia   Past Medical History:  Diagnosis Date  . Gout   . Hyperlipidemia   . Hypertension    Past Surgical History:  Procedure Laterality Date  . left knee surgeries  1983 1993, 2008   Social History  Substance Use Topics  . Smoking status: Former Research scientist (life sciences)  . Smokeless tobacco: Not on file  . Alcohol use 0.0 oz/week   Family History  Problem Relation Age of Onset  . Heart disease Father   . Cancer Sister 66    colon   No Known Allergies Current Outpatient Prescriptions on File Prior to Visit  Medication Sig Dispense Refill  . aspirin 81 MG tablet Take 81 mg by mouth daily.    . Multiple Vitamin (MULTIVITAMIN) tablet Take 1 tablet by mouth daily.    . Omega-3 Fatty Acids (FISH OIL) 1000 MG CAPS Take by mouth.    Marland Kitchen atorvastatin (LIPITOR) 20 MG tablet TAKE 1 TABLET (20 MG TOTAL) BY MOUTH DAILY. (Patient not taking: Reported on 09/14/2016) 90 tablet 3   No current facility-administered  medications on file prior to visit.    The PMH, PSH, Social History, Family History, Medications, and allergies have been reviewed in [facility], and have been updated if relevant.    Review of Systems  Constitutional: Negative.   HENT: Negative.   Eyes: Negative.   Respiratory: Negative.   Cardiovascular: Negative.   Gastrointestinal: Negative.   Genitourinary: Negative.   Musculoskeletal: Negative.   Skin: Negative.   Hematological: Negative.   Psychiatric/Behavioral: Negative.   All other systems reviewed and are negative.       Objective:   Physical Exam BP (!) 146/82   Pulse 75   Temp 98.3 F (36.8 C) (Oral)   Wt 194 lb 12 oz (88.3 kg)   SpO2 95%   BMI 30.28 kg/m   . Wt Readings from Last 3 Encounters:  09/14/16 194 lb 12 oz (88.3 kg)  09/29/15 190 lb (86.2 kg)  11/27/14 187 lb 12.8 oz (85.2 kg)  General:  pleasant male in no acute distress Eyes:  PERRL Ears:  External ear exam shows no significant lesions or deformities.  TMs normal bilaterally Hearing is grossly normal bilaterally. Nose:  External nasal examination shows no deformity or inflammation. Nasal mucosa  are pink and moist without lesions or exudates. Mouth:  Oral mucosa and oropharynx without lesions or exudates.  Teeth in good repair. Neck:  no carotid bruit or thyromegaly no cervical or supraclavicular lymphadenopathy  Lungs:  Normal respiratory effort, chest expands symmetrically. Lungs are clear to auscultation, no crackles or wheezes. Heart:  Normal rate and regular rhythm. S1 and S2 normal without gallop, murmur, click, rub or other extra sounds. Abdomen:  Bowel sounds positive,abdomen soft and non-tender without masses, organomegaly or hernias noted. Pulses:  R and L posterior tibial pulses are full and equal bilaterally  Extremities:  no edema  Psych:  Good eye contact, not anxious or depressed appearing         Assessment & Plan:

## 2016-09-18 DIAGNOSIS — H40003 Preglaucoma, unspecified, bilateral: Secondary | ICD-10-CM | POA: Diagnosis not present

## 2016-11-27 ENCOUNTER — Ambulatory Visit (INDEPENDENT_AMBULATORY_CARE_PROVIDER_SITE_OTHER): Payer: Private Health Insurance - Indemnity | Admitting: Family Medicine

## 2016-11-27 ENCOUNTER — Encounter: Payer: Self-pay | Admitting: Family Medicine

## 2016-11-27 DIAGNOSIS — R040 Epistaxis: Secondary | ICD-10-CM

## 2016-11-27 NOTE — Progress Notes (Signed)
Subjective:   Patient ID: David Turner, male    DOB: 01/03/1951, 66 y.o.   MRN: [id number]  David Turner is a pleasant 66 y.o. year old male who presents to clinic today with Epistaxis (Has had occasional nosebleeds for a long time. 11-23-16 Started bleeding and has had a hard time getting it to stop. )  on 11/27/2016  HPI:   Past week, recurrent left nare nose bleeds.  Bleeds don't last longer than 30 minutes.  Noticed they started shortly after he started using his fireplace in his basement.  Started using a humidifier over the weekend and hasn't had any more bleeding episodes.  Current Outpatient Prescriptions on File Prior to Visit  Medication Sig Dispense Refill  . allopurinol (ZYLOPRIM) 300 MG tablet Take 1 tablet (300 mg total) by mouth daily. 90 tablet 1  . aspirin 81 MG tablet Take 81 mg by mouth daily.    Marland Kitchen latanoprost (XALATAN) 0.005 % ophthalmic solution INT 1 GTT IN OU QD HS  3  . lisinopril-hydrochlorothiazide (PRINZIDE,ZESTORETIC) 20-12.5 MG tablet Take 1 tablet by mouth daily. 90 tablet 1  . Multiple Vitamin (MULTIVITAMIN) tablet Take 1 tablet by mouth daily.    . Omega-3 Fatty Acids (FISH OIL) 1000 MG CAPS Take by mouth.     No current facility-administered medications on file prior to visit.     No Known Allergies  Past Medical History:  Diagnosis Date  . Gout   . Hyperlipidemia   . Hypertension     Past Surgical History:  Procedure Laterality Date  . left knee surgeries  1983 1993, 2008    Family History  Problem Relation Age of Onset  . Heart disease Father   . Cancer Sister 46    colon    Social History   Social History  . Marital status: Married    Spouse name: N/A  . Number of children: N/A  . Years of education: N/A   Occupational History  . Not on file.   Social History Main Topics  . Smoking status: Former Research scientist (life sciences)  . Smokeless tobacco: Never Used  . Alcohol use 0.0 oz/week  . Drug use: Unknown  . Sexual activity: Not on file     Other Topics Concern  . Not on file   Social History Narrative   Married.   Travels to and from [place] often- son still lives there.   The PMH, PSH, Social History, Family History, Medications, and allergies have been reviewed in [facility], and have been updated if relevant.   Review of Systems  Neurological: Negative.   Hematological: Bruises/bleeds easily.  All other systems reviewed and are negative.      Objective:    BP 122/76 (BP Location: Left Arm, Patient Position: Sitting, Cuff Size: Normal)   Pulse 88   Temp 98 F (36.7 C) (Oral)   Wt 187 lb (84.8 kg)   SpO2 95%   BMI 29.07 kg/m    Physical Exam  Constitutional: He is oriented to person, place, and time. He appears well-developed and well-nourished. No distress.  HENT:  Head: Normocephalic and atraumatic.  Nose: No epistaxis.  Cardiovascular: Normal rate.   Pulmonary/Chest: Effort normal.  Musculoskeletal: Normal range of motion.  Neurological: He is alert and oriented to person, place, and time. No cranial nerve deficit.  Skin: Skin is warm and dry. He is not diaphoretic.  Psychiatric: He has a normal mood and affect. His behavior is normal. Judgment and thought content normal.  Nursing note  and vitals reviewed.         Assessment & Plan:   Frequent nosebleeds No Follow-up on file.

## 2016-11-27 NOTE — Progress Notes (Signed)
Pre visit review using our clinic review tool, if applicable. No additional management support is needed unless otherwise documented below in the visit note. 

## 2016-11-27 NOTE — Assessment & Plan Note (Signed)
Likely an irritated surface blood vessel in left nare.  Bleeding has stopped. Advised continued use of humidifier.  If bleeding greater than 30 minutes, to see emergency care. The patient indicates understanding of these issues and agrees with the plan.

## 2016-11-28 ENCOUNTER — Telehealth: Payer: Self-pay | Admitting: Family Medicine

## 2016-11-28 DIAGNOSIS — R040 Epistaxis: Secondary | ICD-10-CM

## 2016-11-28 NOTE — Telephone Encounter (Signed)
Pt called in - his nose is still bleeding, he would like referral to ENT. He prefers [facility].   cb number is [phone] Thanks

## 2016-11-28 NOTE — Telephone Encounter (Signed)
Referral placed.

## 2016-12-26 DIAGNOSIS — R040 Epistaxis: Secondary | ICD-10-CM | POA: Diagnosis not present

## 2016-12-26 DIAGNOSIS — J340 Abscess, furuncle and carbuncle of nose: Secondary | ICD-10-CM | POA: Diagnosis not present

## 2017-02-27 ENCOUNTER — Other Ambulatory Visit: Payer: Self-pay | Admitting: Family Medicine

## 2017-03-19 DIAGNOSIS — Z961 Presence of intraocular lens: Secondary | ICD-10-CM | POA: Diagnosis not present

## 2017-09-18 DIAGNOSIS — H40003 Preglaucoma, unspecified, bilateral: Secondary | ICD-10-CM | POA: Diagnosis not present

## 2017-10-01 DIAGNOSIS — Z961 Presence of intraocular lens: Secondary | ICD-10-CM | POA: Diagnosis not present

## 2017-11-24 ENCOUNTER — Other Ambulatory Visit: Payer: Self-pay | Admitting: Family Medicine

## 2017-11-26 ENCOUNTER — Ambulatory Visit (INDEPENDENT_AMBULATORY_CARE_PROVIDER_SITE_OTHER): Payer: Medicare Other | Admitting: Family Medicine

## 2017-11-26 ENCOUNTER — Encounter: Payer: Self-pay | Admitting: Family Medicine

## 2017-11-26 VITALS — BP 148/78 | HR 85 | Temp 98.3°F | Ht 67.25 in | Wt 179.6 lb

## 2017-11-26 DIAGNOSIS — Z23 Encounter for immunization: Secondary | ICD-10-CM | POA: Diagnosis not present

## 2017-11-26 DIAGNOSIS — I10 Essential (primary) hypertension: Secondary | ICD-10-CM | POA: Diagnosis not present

## 2017-11-26 DIAGNOSIS — E785 Hyperlipidemia, unspecified: Secondary | ICD-10-CM | POA: Diagnosis not present

## 2017-11-26 DIAGNOSIS — J309 Allergic rhinitis, unspecified: Secondary | ICD-10-CM | POA: Diagnosis not present

## 2017-11-26 DIAGNOSIS — N529 Male erectile dysfunction, unspecified: Secondary | ICD-10-CM

## 2017-11-26 LAB — COMPREHENSIVE METABOLIC PANEL
ALBUMIN: 4.6 g/dL (ref 3.5–5.2)
ALK PHOS: 75 U/L (ref 39–117)
ALT: 35 U/L (ref 0–53)
AST: 27 U/L (ref 0–37)
BUN: 13 mg/dL (ref 6–23)
CALCIUM: 10.4 mg/dL (ref 8.4–10.5)
CO2: 27 mEq/L (ref 19–32)
Chloride: 101 mEq/L (ref 96–112)
Creatinine, Ser: 0.77 mg/dL (ref 0.40–1.50)
GFR: 107.25 mL/min (ref >60.00)
Glucose, Bld: 95 mg/dL (ref 70–99)
POTASSIUM: 4.7 meq/L (ref 3.5–5.1)
Sodium: 139 mEq/L (ref 135–145)
TOTAL PROTEIN: 7.1 g/dL (ref 6.0–8.3)
Total Bilirubin: 0.7 mg/dL (ref 0.2–1.2)

## 2017-11-26 LAB — LIPID PANEL
CHOLESTEROL: 212 mg/dL — AB (ref 0–200)
HDL: 65.8 mg/dL (ref >39.00)
LDL Cholesterol: 120 mg/dL — ABNORMAL HIGH (ref 0–99)
NonHDL: 146.17
Total CHOL/HDL Ratio: 3
Triglycerides: 130 mg/dL (ref 0.0–149.0)
VLDL: 26 mg/dL (ref 0.0–40.0)

## 2017-11-26 MED ORDER — TADALAFIL 20 MG PO TABS: 10.0000 mg | ORAL_TABLET | ORAL | 11 refills | Status: DC | PRN

## 2017-11-26 MED ORDER — LISINOPRIL-HYDROCHLOROTHIAZIDE 20-12.5 MG PO TABS: 1.0000 | ORAL_TABLET | Freq: Every day | ORAL | 3 refills | Status: DC

## 2017-11-26 NOTE — Assessment & Plan Note (Signed)
Lab today.

## 2017-11-26 NOTE — Progress Notes (Signed)
Subjective:   Patient ID: David Turner, male    DOB: May 23, 1951, 67 y.o.   MRN: [id number]  David Turner is a pleasant 67 y.o. year old male who presents to clinic today with Hypertension (Patient is here today to F/U with HTN.  He says that he does not check them often and it has been down to 120/70.  He is currently fasting.) and Sinusitis (Patient is also here today C/O sinus congestion. States that he has post nasal drip and is hacking a lot.  In the morning he has been coughin up yellow sputum.  Denies any sinus pressure or otalgia.  He states that this is a constant thing all his life.)  on 11/26/2017  HPI:  HTN- taking Prinzide daily.  Does not check BP often at home but when he does, he has been normotensive.  Sinus congestion- has had increased PND and coughing.  Cough is productive of yellow sputum in the morning.  He feels this has been a constant cough for him his "entire life."  Not taking a daily antihistamine.  He is also asking for a refill of Cialis for ED.  Has taken this before without side effects.  Current Outpatient Medications on File Prior to Visit  Medication Sig Dispense Refill  . aspirin 81 MG tablet Take 81 mg by mouth daily.    . Garlic 4401 MG CAPS Take 2 capsules by mouth.    . latanoprost (XALATAN) 0.005 % ophthalmic solution INT 1 GTT IN OU QD HS  3  . Multiple Vitamin (MULTIVITAMIN) tablet Take 1 tablet by mouth daily.    . Omega-3 Fatty Acids (FISH OIL) 1000 MG CAPS Take by mouth.    Marland Kitchen allopurinol (ZYLOPRIM) 300 MG tablet TAKE 1 TABLET(300 MG) BY MOUTH DAILY 90 tablet 2   No current facility-administered medications on file prior to visit.     No Known Allergies  Past Medical History:  Diagnosis Date  . Gout   . Hyperlipidemia   . Hypertension     Past Surgical History:  Procedure Laterality Date  . left knee surgeries  1983 1993, 2008    Family History  Problem Relation Age of Onset  . Heart disease Father   . Cancer Sister 96   colon    Social History   Socioeconomic History  . Marital status: Married    Spouse name: Not on file  . Number of children: Not on file  . Years of education: Not on file  . Highest education level: Not on file  Social Needs  . Financial resource strain: Not on file  . Food insecurity - worry: Not on file  . Food insecurity - inability: Not on file  . Transportation needs - medical: Not on file  . Transportation needs - non-medical: Not on file  Occupational History  . Not on file  Tobacco Use  . Smoking status: Former Research scientist (life sciences)  . Smokeless tobacco: Never Used  Substance and Sexual Activity  . Alcohol use: Yes    Alcohol/week: 0.0 oz  . Drug use: Not on file  . Sexual activity: Not on file  Other Topics Concern  . Not on file  Social History Narrative   Married.   Travels to and from [place] often- son still lives there.   The PMH, PSH, Social History, Family History, Medications, and allergies have been reviewed in [facility], and have been updated if relevant.   Review of Systems  Constitutional: Negative.   HENT: Positive for  congestion and postnasal drip. Negative for dental problem, drooling, ear discharge, ear pain, facial swelling, hearing loss, mouth sores, nosebleeds, rhinorrhea, sinus pressure, sinus pain, sneezing, sore throat, tinnitus and trouble swallowing.   Respiratory: Negative.   Cardiovascular: Negative.   Genitourinary: Negative for difficulty urinating, dysuria, enuresis, flank pain, frequency, genital sores, hematuria, penile pain, penile swelling and scrotal swelling.  All other systems reviewed and are negative.      Objective:    BP (!) 148/78 (BP Location: Left Arm, Patient Position: Sitting, Cuff Size: Normal)   Pulse 85   Temp 98.3 F (36.8 C) (Oral)   Ht 5' 7.25" (1.708 m)   Wt 179 lb 9.6 oz (81.5 kg)   SpO2 98%   BMI 27.92 kg/m    Physical Exam  Constitutional: He is oriented to person, place, and time. He appears well-developed and  well-nourished.  HENT:  Head: Normocephalic and atraumatic.  Right Ear: Hearing, tympanic membrane and external ear normal.  Left Ear: Hearing, tympanic membrane and external ear normal.  Nose: Mucosal edema present. Right sinus exhibits no maxillary sinus tenderness and no frontal sinus tenderness. Left sinus exhibits no maxillary sinus tenderness and no frontal sinus tenderness.  +PND  Cardiovascular: Regular rhythm.  Pulmonary/Chest: Effort normal and breath sounds normal.  Musculoskeletal: Normal range of motion. He exhibits no edema.  Neurological: He is alert and oriented to person, place, and time. No cranial nerve deficit.  Skin: Skin is warm and dry. He is not diaphoretic.  Psychiatric: He has a normal mood and affect. His behavior is normal. Judgment and thought content normal.  Nursing note and vitals reviewed.         Assessment & Plan:   Essential hypertension  Need for pneumococcal vaccine - Plan: Pneumococcal conjugate vaccine 13-valent IM  Need for diphtheria-tetanus-pertussis (Tdap) vaccine - Plan: Tdap vaccine greater than or equal to 7yo IM  Erectile dysfunction, unspecified erectile dysfunction type  Hyperlipidemia, unspecified hyperlipidemia type - Plan: Comprehensive metabolic panel, Lipid panel No Follow-up on file.

## 2017-11-26 NOTE — Assessment & Plan Note (Signed)
Cialis eRx refills sent. He is aware of side effects and has tolerated this well in the past. Call or return to clinic prn if these symptoms worsen or fail to improve as anticipated. The patient indicates understanding of these issues and agrees with the plan.

## 2017-11-26 NOTE — Assessment & Plan Note (Signed)
Well controlled. eRx refills sent. 

## 2017-11-26 NOTE — Assessment & Plan Note (Signed)
No signs of infection. Advised daily antihistamine. Call or return to clinic prn if these symptoms worsen or fail to improve as anticipated. The patient indicates understanding of these issues and agrees with the plan.

## 2017-11-26 NOTE — Patient Instructions (Signed)
Great to see you. Try Zyrtec 10 mg daily.

## 2017-12-19 ENCOUNTER — Encounter: Payer: Self-pay | Admitting: Family Medicine

## 2017-12-19 ENCOUNTER — Ambulatory Visit (INDEPENDENT_AMBULATORY_CARE_PROVIDER_SITE_OTHER): Payer: Medicare Other | Admitting: Family Medicine

## 2017-12-19 VITALS — BP 124/84 | HR 76 | Temp 98.4°F | Ht 67.25 in | Wt 177.6 lb

## 2017-12-19 DIAGNOSIS — M62838 Other muscle spasm: Secondary | ICD-10-CM | POA: Diagnosis not present

## 2017-12-19 DIAGNOSIS — M25512 Pain in left shoulder: Secondary | ICD-10-CM

## 2017-12-19 MED ORDER — METHYLPREDNISOLONE ACETATE 40 MG/ML IJ SUSP
40.0000 mg | Freq: Once | INTRAMUSCULAR | Status: AC
  Administered 2017-12-19: 40 mg via INTRAMUSCULAR

## 2017-12-19 MED ORDER — CYCLOBENZAPRINE HCL 5 MG PO TABS: 5.0000 mg | ORAL_TABLET | Freq: Three times a day (TID) | ORAL | 1 refills | Status: DC | PRN

## 2017-12-19 NOTE — Patient Instructions (Signed)
Great to see you. Within 48- 72 hours, you can start taking motrin or alleve.  Cyclobenzaprine at bedtime (muscle relaxant).  You can start that tonight.

## 2017-12-19 NOTE — Progress Notes (Signed)
Subjective:   Patient ID: David Turner, male    DOB: 1951-04-16, 67 y.o.   MRN: [id number]  David Turner is a pleasant 67 y.o. year old male who presents to clinic today with Shoulder Pain (L shoulder pain, Monday picked up a box and turned and pain instantly started. Tried heating pad and motrin and it helped.. Leaving for [place] today, driving.)  on 5/78/4696  HPI: Picked up a box on Monday and now having left shoulder and neck pain.  Has FROM of shoulder but cannot move his head to the left without pain.  No tingling or weakness in left UE.  Normal grip strength.  Has tried heating pad and motrin which only helped a little.   Current Outpatient Medications on File Prior to Visit  Medication Sig Dispense Refill  . allopurinol (ZYLOPRIM) 300 MG tablet TAKE 1 TABLET(300 MG) BY MOUTH DAILY 90 tablet 2  . aspirin 81 MG tablet Take 81 mg by mouth daily.    . Garlic 2952 MG CAPS Take 2 capsules by mouth.    Marland Kitchen gentamicin ointment (GARAMYCIN) 0.1 % APP TOPICALLY TO BOTH NOSTRIL BID FOR 14 DAYS  5  . latanoprost (XALATAN) 0.005 % ophthalmic solution INT 1 GTT IN OU QD HS  3  . lisinopril-hydrochlorothiazide (PRINZIDE,ZESTORETIC) 20-12.5 MG tablet Take 1 tablet by mouth daily. 90 tablet 3  . Multiple Vitamin (MULTIVITAMIN) tablet Take 1 tablet by mouth daily.    . Omega-3 Fatty Acids (FISH OIL) 1000 MG CAPS Take by mouth.    . tadalafil (CIALIS) 20 MG tablet Take 0.5-1 tablets (10-20 mg total) by mouth every other day as needed for erectile dysfunction. 5 tablet 11   No current facility-administered medications on file prior to visit.     No Known Allergies  Past Medical History:  Diagnosis Date  . Gout   . Hyperlipidemia   . Hypertension     Past Surgical History:  Procedure Laterality Date  . left knee surgeries  1983 1993, 2008    Family History  Problem Relation Age of Onset  . Heart disease Father   . Cancer Sister 66       colon    Social History   Socioeconomic  History  . Marital status: Married    Spouse name: Not on file  . Number of children: Not on file  . Years of education: Not on file  . Highest education level: Not on file  Occupational History  . Not on file  Social Needs  . Financial resource strain: Not on file  . Food insecurity:    Worry: Not on file    Inability: Not on file  . Transportation needs:    Medical: Not on file    Non-medical: Not on file  Tobacco Use  . Smoking status: Former Research scientist (life sciences)  . Smokeless tobacco: Never Used  Substance and Sexual Activity  . Alcohol use: Yes    Alcohol/week: 0.0 oz  . Drug use: Not on file  . Sexual activity: Not on file  Lifestyle  . Physical activity:    Days per week: Not on file    Minutes per session: Not on file  . Stress: Not on file  Relationships  . Social connections:    Talks on phone: Not on file    Gets together: Not on file    Attends religious service: Not on file    Active member of club or organization: Not on file    Attends meetings  of clubs or organizations: Not on file    Relationship status: Not on file  . Intimate partner violence:    Fear of current or ex partner: Not on file    Emotionally abused: Not on file    Physically abused: Not on file    Forced sexual activity: Not on file  Other Topics Concern  . Not on file  Social History Narrative   Married.   Travels to and from [place] often- son still lives there.   The PMH, PSH, Social History, Family History, Medications, and allergies have been reviewed in [facility], and have been updated if relevant.   Review of Systems  Musculoskeletal: Positive for neck pain and neck stiffness. Negative for arthralgias, back pain, gait problem, joint swelling and myalgias.  Neurological: Negative.   All other systems reviewed and are negative.      Objective:    BP 124/84 (BP Location: Left Arm, Patient Position: Sitting, Cuff Size: Normal)   Pulse 76   Temp 98.4 F (36.9 C) (Oral)   Ht 5' 7.25" (1.708 m)    Wt 177 lb 9.6 oz (80.6 kg)   SpO2 96%   BMI 27.61 kg/m    Physical Exam  Constitutional: He is oriented to person, place, and time. He appears well-developed and well-nourished. No distress.  HENT:  Head: Normocephalic and atraumatic.  Cardiovascular: Normal rate.  Pulmonary/Chest: Effort normal.  Musculoskeletal:       Left shoulder: Normal.       Cervical back: He exhibits tenderness, pain and spasm. He exhibits no bony tenderness, no swelling, no edema, no deformity, no laceration and normal pulse.  Neurological: He is alert and oriented to person, place, and time. No cranial nerve deficit.  Skin: Skin is dry. He is not diaphoretic.  Nursing note and vitals reviewed.         Assessment & Plan:   No diagnosis found. No follow-ups on file.

## 2017-12-20 DIAGNOSIS — M62838 Other muscle spasm: Secondary | ICD-10-CM | POA: Insufficient documentation

## 2017-12-20 NOTE — Assessment & Plan Note (Signed)
Given IM depo medrol as he has to drive 16 hours today- attempt to reduce inflammation quickly. Advised not to take NSAIDs for 48-72 hours. Flexeril at bedtime or when not driving up to three times daily, heating pad,tylenol and exercises. Call or return to clinic prn if these symptoms worsen or fail to improve as anticipated. Consider imaging and or PT if no improvement. No red flag symptoms or signs on exam.

## 2018-01-14 NOTE — Progress Notes (Signed)
Subjective:   David Turner is a 67 y.o. male who presents for an Initial Medicare Annual Wellness Visit. The Patient was informed that the wellness visit is to identify future health risk and educate and initiate measures that can reduce risk for increased disease through the lifespan.   Describes health as fair, good or great? Good   Review of Systems No ROS.  Medicare Wellness Visit. Additional risk factors are reflected in the social history.  Cardiac Risk Factors include: advanced age (>18men, >59 women) Sleep patterns: wakes once to urinate. Light sleeper per pt. Sleeps 9-10 hrs. Feels rested.  Home Safety/Smoke Alarms: Feels safe in home. Smoke alarms in place.  Living environment; residence and Firearm Safety: Lives in basement apt with wife ( of son and his family's house).    Male:   CCS-  Last reported normal in 2013   PSA-  Lab Results  Component Value Date   PSA 1.36 09/29/2015   PSA 1.43 11/05/2013   PSA 1.29 08/20/2012      Objective:    Today's Vitals   01/16/18 0929  BP: (!) 144/80  Pulse: 62  SpO2: 98%  Weight: 178 lb 6.4 oz (80.9 kg)  Height: 5' 8.5" (1.74 m)   Body mass index is 26.73 kg/m.  Advanced Directives 01/16/2018  Does Patient Have a Medical Advance Directive? No  Would patient like information on creating a medical advance directive? Yes (MAU/Ambulatory/Procedural Areas - Information given)    Current Medications (verified) Outpatient Encounter Medications as of 01/16/2018  Medication Sig  . allopurinol (ZYLOPRIM) 300 MG tablet TAKE 1 TABLET(300 MG) BY MOUTH DAILY  . aspirin 81 MG tablet Take 81 mg by mouth daily.  . Garlic 2423 MG CAPS Take 2 capsules by mouth.  . latanoprost (XALATAN) 0.005 % ophthalmic solution INT 1 GTT IN OU QD HS  . lisinopril-hydrochlorothiazide (PRINZIDE,ZESTORETIC) 20-12.5 MG tablet Take 1 tablet by mouth daily.  . Multiple Vitamin (MULTIVITAMIN) tablet Take 1 tablet by mouth daily.  . Omega-3 Fatty Acids  (FISH OIL) 1000 MG CAPS Take by mouth.  . tadalafil (CIALIS) 20 MG tablet Take 0.5-1 tablets (10-20 mg total) by mouth every other day as needed for erectile dysfunction.  . cyclobenzaprine (FLEXERIL) 5 MG tablet Take 1 tablet (5 mg total) by mouth 3 (three) times daily as needed for muscle spasms. (Patient not taking: Reported on 01/16/2018)  . gentamicin ointment (GARAMYCIN) 0.1 % APP TOPICALLY TO BOTH NOSTRIL BID FOR 14 DAYS   No facility-administered encounter medications on file as of 01/16/2018.     Allergies (verified) Patient has no known allergies.   History: Past Medical History:  Diagnosis Date  . Gout   . Hyperlipidemia   . Hypertension    Past Surgical History:  Procedure Laterality Date  . left knee surgeries  1983 1993, 2008   Family History  Problem Relation Age of Onset  . Heart disease Father   . Cancer Sister 40       colon  . Heart disease Brother    Social History   Socioeconomic History  . Marital status: Married    Spouse name: Not on file  . Number of children: Not on file  . Years of education: Not on file  . Highest education level: Not on file  Occupational History  . Not on file  Social Needs  . Financial resource strain: Not on file  . Food insecurity:    Worry: Not on file    Inability:  Not on file  . Transportation needs:    Medical: Not on file    Non-medical: Not on file  Tobacco Use  . Smoking status: Former Research scientist (life sciences)  . Smokeless tobacco: Never Used  . Tobacco comment: quit 2002  Substance and Sexual Activity  . Alcohol use: Yes    Alcohol/week: 0.0 oz    Comment: 6-8 beers  . Drug use: Never  . Sexual activity: Not on file  Lifestyle  . Physical activity:    Days per week: Not on file    Minutes per session: Not on file  . Stress: Not on file  Relationships  . Social connections:    Talks on phone: Not on file    Gets together: Not on file    Attends religious service: Not on file    Active member of club or organization:  Not on file    Attends meetings of clubs or organizations: Not on file    Relationship status: Not on file  Other Topics Concern  . Not on file  Social History Narrative   Married.   Travels to and from [place] often- son still lives there.   Tobacco Counseling Counseling given: Not Answered Comment: quit 2002   Clinical Intake: Pain : No/denies pain   Activities of Daily Living In your present state of health, do you have any difficulty performing the following activities: 01/16/2018 11/26/2017  Hearing? N N  Vision? N N  Difficulty concentrating or making decisions? N N  Walking or climbing stairs? N N  Dressing or bathing? N N  Doing errands, shopping? N N  Preparing Food and eating ? N -  Using the Toilet? N -  In the past six months, have you accidently leaked urine? N -  Do you have problems with loss of bowel control? N -  Managing your Medications? N -  Managing your Finances? N -  Housekeeping or managing your Housekeeping? N -  Some recent data might be hidden     Immunizations and Health Maintenance Immunization History  Administered Date(s) Administered  . Pneumococcal Conjugate-13 11/26/2017  . Tdap 11/26/2017  . Zoster 11/05/2013   There are no preventive care reminders to display for this patient.  Patient Care Team: Lucille Passy, MD as PCP - General (Family Medicine)  Indicate any recent Medical Services you may have received from other than [facility] providers in the past year (date may be approximate).    Assessment:   This is a routine wellness examination for David Turner. Physical assessment deferred to PCP.  Hearing/Vision screen  Visual Acuity Screening   Right eye Left eye Both eyes  Without correction: 20/20 20/20 20/20   With correction:     Hearing Screening Comments: Able to hear conversational tones w/o difficulty. No issues reported.    Dietary issues and exercise activities discussed: Exercise limited by: None identified Diet (meal  preparation, eat out, water intake, caffeinated beverages, dairy products, fruits and vegetables): well balanced, on average, 3 meals per day Breakfast: banana loaf bread or fruit. No coffee. Water. Lunch: veggie tray, chicken, water Dinner: burger   beer  Goals    . Increase physical activity     Walk with wife 2x/ week      Depression Screen PHQ 2/9 Scores 01/16/2018 11/26/2017 09/29/2015  PHQ - 2 Score 0 0 0    Fall Risk Fall Risk  01/16/2018 11/26/2017  Falls in the past year? No No    Cognitive Function: Ad8 score reviewed  for issues:  Issues making decisions:no  Less interest in hobbies / activities:no  Repeats questions, stories (family complaining):no  Trouble using ordinary gadgets (microwave, computer, phone):no  Forgets the month or year: no  Mismanaging finances: no  Remembering appts:no  Daily problems with thinking and/or memory:no Ad8 score is=0        Screening Tests Health Maintenance  Topic Date Due  . INFLUENZA VACCINE  07/29/2018 (Originally 04/11/2018)  . PNA vac Low Risk Adult (2 of 2 - PPSV23) 11/27/2018  . COLONOSCOPY  06/20/2022  . TETANUS/TDAP  11/27/2027  . Hepatitis C Screening  Completed        Plan:   Follow up with Dr.Aron as scheduled 01/22/18.  Continue to eat heart healthy diet (full of fruits, vegetables, whole grains, lean protein, water--limit salt, fat, and sugar intake) and increase physical activity as tolerated.  Continue doing brain stimulating activities (puzzles, reading, adult coloring books, staying active) to keep memory sharp.   Bring a copy of your living will and/or healthcare power of attorney to your next office visit.   I have personally reviewed and noted the following in the patient's chart:   . Medical and social history . Use of alcohol, tobacco or illicit drugs  . Current medications and supplements . Functional ability and status . Nutritional status . Physical activity . Advanced  directives . List of other physicians . Hospitalizations, surgeries, and ER visits in previous 12 months . Vitals . Screenings to include cognitive, depression, and falls . Referrals and appointments  In addition, I have reviewed and discussed with patient certain preventive protocols, quality metrics, and best practice recommendations. A written personalized care plan for preventive services as well as general preventive health recommendations were provided to patient.     Shela Nevin, [place]   01/16/2018

## 2018-01-16 ENCOUNTER — Encounter: Payer: Self-pay | Admitting: Behavioral Health

## 2018-01-16 ENCOUNTER — Other Ambulatory Visit: Payer: Self-pay

## 2018-01-16 ENCOUNTER — Ambulatory Visit (INDEPENDENT_AMBULATORY_CARE_PROVIDER_SITE_OTHER): Payer: Medicare Other | Admitting: Behavioral Health

## 2018-01-16 VITALS — BP 144/80 | HR 62 | Ht 68.5 in | Wt 178.4 lb

## 2018-01-16 DIAGNOSIS — Z125 Encounter for screening for malignant neoplasm of prostate: Secondary | ICD-10-CM | POA: Diagnosis not present

## 2018-01-16 DIAGNOSIS — Z0000 Encounter for general adult medical examination without abnormal findings: Secondary | ICD-10-CM

## 2018-01-16 DIAGNOSIS — I10 Essential (primary) hypertension: Secondary | ICD-10-CM

## 2018-01-16 LAB — CBC WITH DIFFERENTIAL/PLATELET
Basophils Absolute: 0 10*3/uL (ref 0.0–0.1)
Basophils Relative: 0.3 % (ref 0.0–3.0)
Eosinophils Absolute: 0.1 10*3/uL (ref 0.0–0.7)
Eosinophils Relative: 1.1 % (ref 0.0–5.0)
HCT: 42 % (ref 39.0–52.0)
Hemoglobin: 14.2 g/dL (ref 13.0–17.0)
LYMPHS ABS: 1.8 10*3/uL (ref 0.7–4.0)
Lymphocytes Relative: 34.6 % (ref 12.0–46.0)
MCHC: 33.8 g/dL (ref 30.0–36.0)
MCV: 88.4 fl (ref 78.0–100.0)
MONO ABS: 0.4 10*3/uL (ref 0.1–1.0)
MONOS PCT: 7.3 % (ref 3.0–12.0)
NEUTROS ABS: 2.9 10*3/uL (ref 1.4–7.7)
NEUTROS PCT: 56.7 % (ref 43.0–77.0)
PLATELETS: 236 10*3/uL (ref 150.0–400.0)
RBC: 4.75 Mil/uL (ref 4.22–5.81)
RDW: 14.7 % (ref 11.5–15.5)
WBC: 5.2 10*3/uL (ref 4.0–10.5)

## 2018-01-16 LAB — PSA, MEDICARE: PSA: 2.41 ng/mL (ref 0.10–4.00)

## 2018-01-16 NOTE — Addendum Note (Signed)
Addended by: Lynnea Ferrier on: 01/16/2018 09:43 AM   Modules accepted: Orders

## 2018-01-16 NOTE — Patient Instructions (Signed)
Follow up with Dr.Aron as scheduled 01/22/18.  Continue to eat heart healthy diet (full of fruits, vegetables, whole grains, lean protein, water--limit salt, fat, and sugar intake) and increase physical activity as tolerated.  Continue doing brain stimulating activities (puzzles, reading, adult coloring books, staying active) to keep memory sharp.   Bring a copy of your living will and/or healthcare power of attorney to your next office visit.   Mr. David Turner , Thank you for taking time to come for your Medicare Wellness Visit. I appreciate your ongoing commitment to your health goals. Please review the following plan we discussed and let me know if I can assist you in the future.   These are the goals we discussed: Goals    . Increase physical activity     Walk with wife 2x/ week       This is a list of the screening recommended for you and due dates:  Health Maintenance  Topic Date Due  . Flu Shot  07/29/2018*  . Pneumonia vaccines (2 of 2 - PPSV23) 11/27/2018  . Colon Cancer Screening  06/20/2022  . Tetanus Vaccine  11/27/2027  .  Hepatitis C: One time screening is recommended by Center for Disease Control  (CDC) for  adults born from [birth year] through 1965.   Completed  *Topic was postponed. The date shown is not the original due date.    Health Maintenance, Male A healthy lifestyle and preventive care is important for your health and wellness. Ask your health care provider about what schedule of regular examinations is right for you. What should I know about weight and diet? Eat a Healthy Diet  Eat plenty of vegetables, fruits, whole grains, low-fat dairy products, and lean protein.  Do not eat a lot of foods high in solid fats, added sugars, or salt.  Maintain a Healthy Weight Regular exercise can help you achieve or maintain a healthy weight. You should:  Do at least 150 minutes of exercise each week. The exercise should increase your heart rate and make you sweat  (moderate-intensity exercise).  Do strength-training exercises at least twice a week.  Watch Your Levels of Cholesterol and Blood Lipids  Have your blood tested for lipids and cholesterol every 5 years starting at 67 years of age. If you are at high risk for heart disease, you should start having your blood tested when you are 67 years old. You may need to have your cholesterol levels checked more often if: ? Your lipid or cholesterol levels are high. ? You are older than 67 years of age. ? You are at high risk for heart disease.  What should I know about cancer screening? Many types of cancers can be detected early and may often be prevented. Lung Cancer  You should be screened every year for lung cancer if: ? You are a current smoker who has smoked for at least 30 years. ? You are a former smoker who has quit within the past 15 years.  Talk to your health care provider about your screening options, when you should start screening, and how often you should be screened.  Colorectal Cancer  Routine colorectal cancer screening usually begins at 67 years of age and should be repeated every 5-10 years until you are 67 years old. You may need to be screened more often if early forms of precancerous polyps or small growths are found. Your health care provider may recommend screening at an earlier age if you have risk factors for colon  cancer.  Your health care provider may recommend using home test kits to check for hidden blood in the stool.  A small camera at the end of a tube can be used to examine your colon (sigmoidoscopy or colonoscopy). This checks for the earliest forms of colorectal cancer.  Prostate and Testicular Cancer  Depending on your age and overall health, your health care provider may do certain tests to screen for prostate and testicular cancer.  Talk to your health care provider about any symptoms or concerns you have about testicular or prostate cancer.  Skin  Cancer  Check your skin from head to toe regularly.  Tell your health care provider about any new moles or changes in moles, especially if: ? There is a change in a mole's size, shape, or color. ? You have a mole that is larger than a pencil eraser.  Always use sunscreen. Apply sunscreen liberally and repeat throughout the day.  Protect yourself by wearing long sleeves, pants, a wide-brimmed hat, and sunglasses when outside.  What should I know about heart disease, diabetes, and high blood pressure?  If you are 38-47 years of age, have your blood pressure checked every 3-5 years. If you are 28 years of age or older, have your blood pressure checked every year. You should have your blood pressure measured twice-once when you are at a hospital or clinic, and once when you are not at a hospital or clinic. Record the average of the two measurements. To check your blood pressure when you are not at a hospital or clinic, you can use: ? An automated blood pressure machine at a pharmacy. ? A home blood pressure monitor.  Talk to your health care provider about your target blood pressure.  If you are between 73-4 years old, ask your health care provider if you should take aspirin to prevent heart disease.  Have regular diabetes screenings by checking your fasting blood sugar level. ? If you are at a normal weight and have a low risk for diabetes, have this test once every three years after the age of 61. ? If you are overweight and have a high risk for diabetes, consider being tested at a younger age or more often.  A one-time screening for abdominal aortic aneurysm (AAA) by ultrasound is recommended for men aged 33-75 years who are current or former smokers. What should I know about preventing infection? Hepatitis B If you have a higher risk for hepatitis B, you should be screened for this virus. Talk with your health care provider to find out if you are at risk for hepatitis B  infection. Hepatitis C Blood testing is recommended for:  Everyone born from [birth year] through 1965.  Anyone with known risk factors for hepatitis C.  Sexually Transmitted Diseases (STDs)  You should be screened each year for STDs including gonorrhea and chlamydia if: ? You are sexually active and are younger than 67 years of age. ? You are older than 67 years of age and your health care provider tells you that you are at risk for this type of infection. ? Your sexual activity has changed since you were last screened and you are at an increased risk for chlamydia or gonorrhea. Ask your health care provider if you are at risk.  Talk with your health care provider about whether you are at high risk of being infected with HIV. Your health care provider may recommend a prescription medicine to help prevent HIV infection.  What else can  I do?  Schedule regular health, dental, and eye exams.  Stay current with your vaccines (immunizations).  Do not use any tobacco products, such as cigarettes, chewing tobacco, and e-cigarettes. If you need help quitting, ask your health care provider.  Limit alcohol intake to no more than 2 drinks per day. One drink equals 12 ounces of beer, 5 ounces of wine, or 1 ounces of hard liquor.  Do not use street drugs.  Do not share needles.  Ask your health care provider for help if you need support or information about quitting drugs.  Tell your health care provider if you often feel depressed.  Tell your health care provider if you have ever been abused or do not feel safe at home. This information is not intended to replace advice given to you by your health care provider. Make sure you discuss any questions you have with your health care provider. Document Released: 02/24/2008 Document Revised: 04/26/2016 Document Reviewed: 06/01/2015 Elsevier Interactive Patient Education  [employer].

## 2018-01-16 NOTE — Progress Notes (Signed)
I reviewed health advisor's note, was available for consultation, and agree with documentation and plan.  

## 2018-01-22 ENCOUNTER — Ambulatory Visit: Payer: Medicare Other | Admitting: Family Medicine

## 2018-01-22 ENCOUNTER — Encounter: Payer: Self-pay | Admitting: Family Medicine

## 2018-01-22 ENCOUNTER — Ambulatory Visit (INDEPENDENT_AMBULATORY_CARE_PROVIDER_SITE_OTHER): Payer: Medicare Other | Admitting: Family Medicine

## 2018-01-22 VITALS — BP 118/72 | HR 104 | Temp 99.1°F | Ht 68.5 in | Wt 176.0 lb

## 2018-01-22 DIAGNOSIS — N529 Male erectile dysfunction, unspecified: Secondary | ICD-10-CM | POA: Diagnosis not present

## 2018-01-22 DIAGNOSIS — M10479 Other secondary gout, unspecified ankle and foot: Secondary | ICD-10-CM | POA: Diagnosis not present

## 2018-01-22 DIAGNOSIS — I10 Essential (primary) hypertension: Secondary | ICD-10-CM | POA: Diagnosis not present

## 2018-01-22 DIAGNOSIS — E785 Hyperlipidemia, unspecified: Secondary | ICD-10-CM

## 2018-01-22 NOTE — Patient Instructions (Addendum)
Great to see you. Have a great trip! 

## 2018-01-22 NOTE — Assessment & Plan Note (Signed)
Continue as needed cialis.

## 2018-01-22 NOTE — Progress Notes (Signed)
Subjective:    Patient ID: David Turner, male    DOB: [birth year], 67 y.o.   MRN: [id number]  HPI  67 yo pleasant male here for  follow up of chronic medical conditions.   Had annual medicare wellness visit with Glenard Haring on 01/16/18.  Note reviewed.  Left shoulder pain- Was driving cross country recently.  Still having some pain with certain movements.  No longer taking flexeril.  HTN- on lisinopril- HCTZ for years.  No CP, SOB or blurred vision.  Also takes ASA 81 mg daily. Lab Results  Component Value Date   CREATININE 0.77 11/26/2017   Gout- last flare was in right great toe years ago.  Has been on allopurinol since.  Wt Readings from Last 3 Encounters:  01/22/18 176 lb (79.8 kg)  01/16/18 178 lb 6.4 oz (80.9 kg)  12/19/17 177 lb 9.6 oz (80.6 kg)   ED- still using Cialis as needed.  Lab Results  Component Value Date   CHOL 212 (H) 11/26/2017   HDL 65.80 11/26/2017   LDLCALC 120 (H) 11/26/2017   LDLDIRECT 141.6 11/05/2013   TRIG 130.0 11/26/2017   CHOLHDL 3 11/26/2017   Lab Results  Component Value Date   ALT 35 11/26/2017   AST 27 11/26/2017   ALKPHOS 75 11/26/2017   BILITOT 0.7 11/26/2017     Patient Active Problem List   Diagnosis Date Noted  . ED (erectile dysfunction) 11/26/2017  . Allergic rhinitis 11/26/2017  . Gout   . Hypertension   . Hyperlipidemia    Past Medical History:  Diagnosis Date  . Gout   . Hyperlipidemia   . Hypertension    Past Surgical History:  Procedure Laterality Date  . left knee surgeries  1983 1993, 2008   Social History   Tobacco Use  . Smoking status: Former Research scientist (life sciences)  . Smokeless tobacco: Never Used  . Tobacco comment: quit 2002  Substance Use Topics  . Alcohol use: Yes    Alcohol/week: 0.0 oz    Comment: 6-8 beers  . Drug use: Never   Family History  Problem Relation Age of Onset  . Heart disease Father   . Cancer Sister 94       colon  . Heart disease Brother    No Known Allergies Current Outpatient  Medications on File Prior to Visit  Medication Sig Dispense Refill  . allopurinol (ZYLOPRIM) 300 MG tablet TAKE 1 TABLET(300 MG) BY MOUTH DAILY 90 tablet 2  . aspirin 81 MG tablet Take 81 mg by mouth daily.    . Garlic 5093 MG CAPS Take 2 capsules by mouth.    Marland Kitchen gentamicin ointment (GARAMYCIN) 0.1 % APP TOPICALLY TO BOTH NOSTRIL BID FOR 14 DAYS  5  . latanoprost (XALATAN) 0.005 % ophthalmic solution INT 1 GTT IN OU QD HS  3  . lisinopril-hydrochlorothiazide (PRINZIDE,ZESTORETIC) 20-12.5 MG tablet Take 1 tablet by mouth daily. 90 tablet 3  . Multiple Vitamin (MULTIVITAMIN) tablet Take 1 tablet by mouth daily.    . Omega-3 Fatty Acids (FISH OIL) 1000 MG CAPS Take by mouth.    . tadalafil (CIALIS) 20 MG tablet Take 0.5-1 tablets (10-20 mg total) by mouth every other day as needed for erectile dysfunction. 5 tablet 11   No current facility-administered medications on file prior to visit.    The PMH, PSH, Social History, Family History, Medications, and allergies have been reviewed in [facility], and have been updated if relevant.    Review of Systems  Constitutional: Negative.  HENT: Negative.   Eyes: Negative.   Respiratory: Negative.   Cardiovascular: Negative.   Gastrointestinal: Negative.   Genitourinary: Negative.   Musculoskeletal: Positive for arthralgias.  Skin: Negative.   Hematological: Negative.   Psychiatric/Behavioral: Negative.   All other systems reviewed and are negative.       Objective:   Physical Exam BP 118/72 (BP Location: Left Arm, Patient Position: Sitting, Cuff Size: Normal)   Pulse (!) 104   Temp 99.1 F (37.3 C) (Oral)   Ht 5' 8.5" (1.74 m)   Wt 176 lb (79.8 kg)   SpO2 (!) 88%   BMI 26.37 kg/m   . Wt Readings from Last 3 Encounters:  01/22/18 176 lb (79.8 kg)  01/16/18 178 lb 6.4 oz (80.9 kg)  12/19/17 177 lb 9.6 oz (80.6 kg)   General:  pleasant male in no acute distress Eyes:  PERRL Ears:  External ear exam shows no significant lesions or  deformities.  TMs normal bilaterally Hearing is grossly normal bilaterally. Nose:  External nasal examination shows no deformity or inflammation. Nasal mucosa are pink and moist without lesions or exudates. Mouth:  Oral mucosa and oropharynx without lesions or exudates.  Teeth in good repair. Neck:  no carotid bruit or thyromegaly no cervical or supraclavicular lymphadenopathy  Lungs:  Normal respiratory effort, chest expands symmetrically. Lungs are clear to auscultation, no crackles or wheezes. Heart:  Normal rate and regular rhythm. S1 and S2 normal without gallop, murmur, click, rub or other extra sounds. Abdomen:  Bowel sounds positive,abdomen soft and non-tender without masses, organomegaly or hernias noted. Pulses:  R and L posterior tibial pulses are full and equal bilaterally  Extremities:  no edema  Psych:  Good eye contact, not anxious or depressed appearing         Assessment & Plan:

## 2018-01-22 NOTE — Assessment & Plan Note (Signed)
Well controlled on current rx. No changes made. 

## 2018-01-22 NOTE — Assessment & Plan Note (Signed)
Now diet controlled. No changes made.

## 2018-01-22 NOTE — Assessment & Plan Note (Signed)
Continue allopurinol 

## 2018-04-01 DIAGNOSIS — H40003 Preglaucoma, unspecified, bilateral: Secondary | ICD-10-CM | POA: Diagnosis not present

## 2018-04-11 ENCOUNTER — Encounter: Payer: Self-pay | Admitting: Family Medicine

## 2018-04-11 ENCOUNTER — Ambulatory Visit (INDEPENDENT_AMBULATORY_CARE_PROVIDER_SITE_OTHER)
Admission: RE | Admit: 2018-04-11 | Discharge: 2018-04-11 | Disposition: A | Discharge status: Home / Self Care | Payer: Medicare Other | Source: Ambulatory Visit | Attending: Family Medicine | Admitting: Family Medicine

## 2018-04-11 ENCOUNTER — Ambulatory Visit: Payer: Medicare Other | Admitting: Family Medicine

## 2018-04-11 ENCOUNTER — Ambulatory Visit (INDEPENDENT_AMBULATORY_CARE_PROVIDER_SITE_OTHER): Payer: Medicare Other | Admitting: Family Medicine

## 2018-04-11 VITALS — BP 138/86 | HR 65 | Temp 98.4°F | Ht 69.0 in | Wt 180.0 lb

## 2018-04-11 DIAGNOSIS — M5412 Radiculopathy, cervical region: Secondary | ICD-10-CM | POA: Insufficient documentation

## 2018-04-11 DIAGNOSIS — M542 Cervicalgia: Secondary | ICD-10-CM | POA: Diagnosis not present

## 2018-04-11 NOTE — Patient Instructions (Signed)
Nice to meet you  I will call you with the results from today  Please try physical therapy  Please follow up in 3-4 weeks if your pain is ongoing.

## 2018-04-11 NOTE — Progress Notes (Signed)
David Turner - 67 y.o. male MRN [id number]  Date of birth: 03-05-1951  SUBJECTIVE:  Including CC & ROS.  Chief Complaint  Patient presents with  . Neck Pain    David Turner is a 67 y.o. male that is presenting with neck pain. Pain has been ongoing for five months. Pain is located on the left lateral aspect of his neck.  He recalls turning his neck and felt a sharp pain afterwards. He has limited range of motion.  Admits to tingling and numbness down his left and right arm. He has not taken anything for the pain.  He feels the pain when he is swimming overhead strokes.  Review of Systems  Constitutional: Negative for fever.  HENT: Negative for congestion.   Respiratory: Negative for cough.   Cardiovascular: Negative for chest pain.  Gastrointestinal: Negative for abdominal pain.  Musculoskeletal: Positive for neck stiffness.  Skin: Negative for color change.  Neurological: Negative for weakness.  Hematological: Negative for adenopathy.  Psychiatric/Behavioral: Negative for agitation.    HISTORY: Past Medical, Surgical, Social, and Family History Reviewed & Updated per EMR.   Pertinent Historical Findings include:  Past Medical History:  Diagnosis Date  . Gout   . Hyperlipidemia   . Hypertension     Past Surgical History:  Procedure Laterality Date  . left knee surgeries  1983 1993, 2008    No Known Allergies  Family History  Problem Relation Age of Onset  . Heart disease Father   . Cancer Sister 48       colon  . Heart disease Brother      Social History   Socioeconomic History  . Marital status: Married    Spouse name: Not on file  . Number of children: Not on file  . Years of education: Not on file  . Highest education level: Not on file  Occupational History  . Not on file  Social Needs  . Financial resource strain: Not on file  . Food insecurity:    Worry: Not on file    Inability: Not on file  . Transportation needs:    Medical: Not on file   Non-medical: Not on file  Tobacco Use  . Smoking status: Former Research scientist (life sciences)  . Smokeless tobacco: Never Used  . Tobacco comment: quit 2002  Substance and Sexual Activity  . Alcohol use: Yes    Alcohol/week: 0.0 oz    Comment: 6-8 beers  . Drug use: Never  . Sexual activity: Not on file  Lifestyle  . Physical activity:    Days per week: Not on file    Minutes per session: Not on file  . Stress: Not on file  Relationships  . Social connections:    Talks on phone: Not on file    Gets together: Not on file    Attends religious service: Not on file    Active member of club or organization: Not on file    Attends meetings of clubs or organizations: Not on file    Relationship status: Not on file  . Intimate partner violence:    Fear of current or ex partner: Not on file    Emotionally abused: Not on file    Physically abused: Not on file    Forced sexual activity: Not on file  Other Topics Concern  . Not on file  Social History Narrative   Married.   Travels to and from [place] often- son still lives there.     PHYSICAL EXAM:  VS: BP 138/86 (BP Location: Left Arm, Patient Position: Sitting, Cuff Size: Normal)   Pulse 65   Temp 98.4 F (36.9 C) (Oral)   Ht 5\' 9"  (1.753 m)   Wt 180 lb (81.6 kg)   SpO2 97%   BMI 26.58 kg/m  Physical Exam Gen: NAD, alert, cooperative with exam, well-appearing ENT: normal lips, normal nasal mucosa,  Eye: normal EOM, normal conjunctiva and lids CV:  no edema, +2 pedal pulses   Resp: no accessory muscle use, non-labored,  Skin: no rashes, no areas of induration  Neuro: normal tone, normal sensation to touch Psych:  normal insight, alert and oriented MSK:  Neck:  No tenderness to palpation over the trapezius or cervical spinal muscles. Limited neck extension. Normal neck flexion and lateral rotation. Normal strength resistance with shrug. Normal shoulder range of motion. Normal grip strength. Normal pincer grasp. Normal finger abduction  and abduction strength resistance. No tenderness to palpation of the midline cervical spine. Neurovascularly intact      ASSESSMENT & PLAN:   Cervical radiculopathy Pain seems to be radicular in nature with it extending down both arms.  Does seem to have spasm in the neck but there is no tenderness to palpation.  This occurs very infrequently and with specific movements.   -X-ray today -Referral to physical therapy. -If no improvement would consider MRI to evaluate for nerve impingement.

## 2018-04-11 NOTE — Assessment & Plan Note (Signed)
Pain seems to be radicular in nature with it extending down both arms.  Does seem to have spasm in the neck but there is no tenderness to palpation.  This occurs very infrequently and with specific movements.   -X-ray today -Referral to physical therapy. -If no improvement would consider MRI to evaluate for nerve impingement.

## 2018-04-11 NOTE — Progress Notes (Unsigned)
   Subjective:   Patient ID: David Turner, male    DOB: 1950/09/21, 67 y.o.   MRN: [id number]  David Turner is a pleasant 67 y.o. year old male who presents to clinic today with Neck Pain (Patient is here today C/O neck pain that radiates into his shoulder.States that in March he P/U a box and something clicked in neck.  He was seen in April for it and given a prednisone shot and it helped.  The day he made this appt he could not turn his head to the left.  He feels like something is wrong in there and is now having numbness in the shoulder as well.)  on 04/11/2018  HPI: ***  Review of Systems     Objective:    There were no vitals taken for this visit.   Physical Exam        Assessment & Plan:   No diagnosis found. No follow-ups on file.

## 2018-04-12 ENCOUNTER — Telehealth: Payer: Self-pay | Admitting: Family Medicine

## 2018-04-12 NOTE — Telephone Encounter (Signed)
Spoke with patient about his xray results.   Rosemarie Ax, MD [facility] 04/12/2018, 8:16 AM

## 2018-05-07 ENCOUNTER — Ambulatory Visit: Payer: Medicare Other | Attending: Family Medicine

## 2018-05-07 ENCOUNTER — Other Ambulatory Visit: Payer: Self-pay

## 2018-05-07 DIAGNOSIS — M5412 Radiculopathy, cervical region: Secondary | ICD-10-CM | POA: Insufficient documentation

## 2018-05-07 NOTE — Therapy (Signed)
[place] [facility] [address], [place], [address] Phone: [phone]   Fax:  [phone]  Physical Therapy Evaluation  Patient Details  Name: David Turner MRN: [id number] Date of Birth: Feb 10, 1951 Referring Provider: Clearance Coots   Encounter Date: 05/07/2018  PT End of Session - 05/07/18 1712    Visit Number  1    Number of Visits  8    Date for PT Re-Evaluation  06/04/18    Authorization Type  medicare    PT Start Time  1612    PT Stop Time  1710    PT Time Calculation (min)  58 min    Activity Tolerance  Patient tolerated treatment well    Behavior During Therapy  [facility] for tasks assessed/performed       Past Medical History:  Diagnosis Date  . Gout   . Hyperlipidemia   . Hypertension     Past Surgical History:  Procedure Laterality Date  . left knee surgeries  1983 1993, 2008      [facility] PT Assessment - 05/07/18 0001      Assessment   Medical Diagnosis  cervical radiculopathy    Referring Provider  Clearance Coots    Onset Date/Surgical Date  12/20/17    Hand Dominance  Right    Prior Therapy  no      Precautions   Precautions  None      Restrictions   Weight Bearing Restrictions  No      Balance Screen   Has the patient fallen in the past 6 months  No    Has the patient had a decrease in activity level because of a fear of falling?   No    Is the patient reluctant to leave their home because of a fear of falling?   No      Home Film/video editor residence    Living Arrangements  Spouse/significant other;Children    Available Help at Discharge  Family    Type of [place]  None      Prior Function   Level of [place]  Retired      Associate Professor   Overall Cognitive Status  Within Functional Limits for tasks assessed      Posture/Postural Control   Posture Comments  elevated  shoulders, forward head      ROM / Strength   AROM / PROM / Strength  AROM;Strength      AROM   Overall AROM   Deficits    AROM Assessment Site  Cervical;Shoulder    Right/Left Shoulder  --   shoulder ROM WFLs, minimal pain with L IR    Cervical Flexion  25    Cervical Extension  40    Cervical - Right Side Bend  30    Cervical - Left Side Bend  25    Cervical - Right Rotation  40    Cervical - Left Rotation  35      Strength   Overall Strength  Within functional limits for tasks performed      Palpation   Palpation comment  tension present in b/l UT, levator, cervical paraspinals      Special Tests    Special Tests  Cervical    Other special tests  palpation of first rib    Cervical Tests  Spurling's;Dictraction      Spurling's   Findings  Positive    Side  Left      Distraction Test   Findngs  Positive    side  Left         Objective measurements completed on examination: See above findings.   Therapeutic exercise: Patient performed with instruction, verbal cues, tactile cues of therapist: goal: maintain ROM Cervical rotations to L and to R x10 with verbal cues to avoid painful ROM.   Manual Therapy: 10 min: goal : spasms, pain, improve ROM cervical spine, decrease radicular symptoms Cervical traction with towel with PT First rib mobilizations with cervical rotation.    Patient response to treatment: Patient reported initially improved pain with L rotation after traction and first rib mobilizations.      PT Education - 05/07/18 1712    Education Details  condition, HEP    Person(s) Educated  Patient    Methods  Explanation;Demonstration    Comprehension  Verbalized understanding;Returned demonstration       PT Short Term Goals - 05/07/18 1716      PT SHORT TERM GOAL #1   Title  Patient will be adherent to HEP at least 3x a week to improve functional strength and ROM at home to indicate ability to self manage condition at home    Time  4     Period  Weeks    Status  New    Target Date  05/21/18        PT Long Term Goals - 05/07/18 1717      PT LONG TERM GOAL #1   Title  Patient will be able to perform household work/ chores without increase in symptoms.    Time  4    Period  Weeks    Status  New    Target Date  06/04/18      PT LONG TERM GOAL #2   Title  Patient will demonstrate adequate cervical ROM and strength to be able to shave and dress independently and turn head while driving with pain less than 2/10.     Time  4    Period  Weeks    Status  New    Target Date  06/04/18      PT LONG TERM GOAL #3   Title  Patient will demonstrate improved ability to perform functional activities with decrease in difficulty indicated by improvement in FOTO score by at least 10 points.    Baseline  FOTO: 59    Time  4    Period  Weeks    Status  New    Target Date  06/04/18             Plan - 05/07/18 1713    Clinical Impression Statement  Patient is 67 yo male that presents with acute on chronic cervical pain with intermittent symptoms of b/l radiculopathy. Upon assessment patient demonstrates limitations in cervical ROM, impaired posture and soft tissue integrity, and decreased ability to perform functional activities such as lifting, carrying, turning his head, driving. The patient would benefit from further skilled PT to address these limitations to improve ability to perform functional activities.     History and Personal Factors relevant to plan of care:  previous cervical pain, gout, postural abnormalities    Clinical Presentation  Stable    Clinical Presentation due to:  symptoms improved initially, remain unchanged    Clinical Decision Making  Low    Rehab Potential  Good    Clinical Impairments Affecting Rehab Potential  decreased cervical ROM, impaired posture, decreased endurance, decreased ability to perform functional activities    PT Frequency  2x / week    PT Duration  4 weeks    PT  Treatment/Interventions  Neuromuscular re-education;Manual techniques;Moist Heat;Traction;Therapeutic activities;Patient/family education;Therapeutic exercise;Cryotherapy;Electrical Stimulation;Dry needling;Passive range of motion    PT Next Visit Plan  first rib mob, traction, STM to cervical musculature, update HEP with stretches, chin tucks    PT Home Exercise Plan  cervical rotation in pain free range    Consulted and Agree with Plan of Care  Patient       Patient will benefit from skilled therapeutic intervention in order to improve the following deficits and impairments:  Increased muscle spasms, Decreased activity tolerance, Pain, Hypomobility, Impaired flexibility, Decreased strength, Decreased mobility  Visit Diagnosis: Cervical radiculopathy     Problem List Patient Active Problem List   Diagnosis Date Noted  . Cervical radiculopathy 04/11/2018  . ED (erectile dysfunction) 11/26/2017  . Allergic rhinitis 11/26/2017  . Gout   . Hypertension   . Hyperlipidemia    [name] PT, DPT 5:31 PM,05/07/18 [place] [facility] [address], [place], [address] Phone: [phone]   Fax:  [phone]  Name: Halil Rentz MRN: [id number] Date of Birth: 01-31-1951

## 2018-05-07 NOTE — Patient Instructions (Signed)
Patient instructed in cervical rotation x10 1-2x daily in pain free range to maintain current ROM.

## 2018-05-14 ENCOUNTER — Ambulatory Visit: Payer: Medicare Other | Attending: Family Medicine

## 2018-05-14 DIAGNOSIS — M5412 Radiculopathy, cervical region: Secondary | ICD-10-CM | POA: Diagnosis not present

## 2018-05-14 NOTE — Therapy (Signed)
[place] [facility] [address] [place], [address] Phone: [phone]   Fax:  [phone]  Physical Therapy Treatment  Patient Details  Name: David Turner MRN: [id number] Date of Birth: 1951-05-21 Referring Provider: Clearance Coots   Encounter Date: 05/14/2018  PT End of Session - 05/14/18 0943    Visit Number  2    Number of Visits  8    Date for PT Re-Evaluation  06/04/18    Authorization Type  medicare    Authorization Time Period  1/10    PT Start Time  0858    PT Stop Time  0940    PT Time Calculation (min)  42 min    Activity Tolerance  Patient tolerated treatment well    Behavior During Therapy  [facility] for tasks assessed/performed       Past Medical History:  Diagnosis Date  . Gout   . Hyperlipidemia   . Hypertension     Past Surgical History:  Procedure Laterality Date  . left knee surgeries  1983 1993, 2008    There were no vitals filed for this visit.  Subjective Assessment - 05/14/18 0858    Subjective  Patient reports that his pain was higher this weekend but today he has just a little. Does not report any change in activity that would lead to an increased pain.     Patient Stated Goals  pain relief, able to turn head with driving    Currently in Pain?  Yes    Pain Score  3     Pain Location  Neck    Pain Orientation  Left    Pain Descriptors / Indicators  Nagging    Pain Type  Chronic pain    Pain Onset  More than a month ago       Objective: TTP and trigger points present in L UT and levator scapulae, 50% improvement noted after STM  Therapeutic exercise: Patient performed with instruction, verbal cues, tactile cues of therapist: goal:?maintain ROM L levator scap stretches 3x20", pain with R stretches UT stretch 3x20" on L Supine chin tucks with verbal/tactile cues 12x3sec holds Patient educated about STM mobilization at home with golf ball/tennis ball to promote tissue  integrity/self care at home.   Manual Therapy: 25 min: goal : spasms, pain, improve ROM cervical spine, decrease radicular symptoms Cervical traction with towel with PT x70mins 18mins STM superficial and deep to L UT, levator scap    Patient response to treatment: Patient reported no pain at end of session, decrease in shoulder pain to 0/10 after manual therapy. Minimal discomfort with cervical stretches.     PT Education - 05/14/18 0944    Education Details  condition, HEP therex    Person(s) Educated  Patient    Methods  Explanation;Demonstration    Comprehension  Verbalized understanding       PT Short Term Goals - 05/07/18 1716      PT SHORT TERM GOAL #1   Title  Patient will be adherent to HEP at least 3x a week to improve functional strength and ROM at home to indicate ability to self manage condition at home    Time  4    Period  Weeks    Status  New    Target Date  05/21/18        PT Long Term Goals - 05/07/18 1717      PT LONG TERM GOAL #1   Title  Patient will be able to perform household work/ chores without increase in symptoms.    Time  4    Period  Weeks    Status  New    Target Date  06/04/18      PT LONG TERM GOAL #2   Title  Patient will demonstrate adequate cervical ROM and strength to be able to shave and dress independently and turn head while driving with pain less than 2/10.     Time  4    Period  Weeks    Status  New    Target Date  06/04/18      PT LONG TERM GOAL #3   Title  Patient will demonstrate improved ability to perform functional activities with decrease in difficulty indicated by improvement in FOTO score by at least 10 points.    Baseline  FOTO: 59    Time  4    Period  Weeks    Status  New    Target Date  06/04/18              Patient will benefit from skilled therapeutic intervention in order to improve the following deficits and impairments:     Visit Diagnosis: Cervical radiculopathy     Problem  List Patient Active Problem List   Diagnosis Date Noted  . Cervical radiculopathy 04/11/2018  . ED (erectile dysfunction) 11/26/2017  . Allergic rhinitis 11/26/2017  . Gout   . Hypertension   . Hyperlipidemia     [name] PT, DPT 9:45 AM,05/14/18 [place] [facility] [address], [place], [address] Phone: [phone]   Fax:  [phone]  Name: David Turner MRN: [id number] Date of Birth: 1951/01/11

## 2018-05-16 ENCOUNTER — Ambulatory Visit: Payer: Medicare Other

## 2018-05-16 DIAGNOSIS — M5412 Radiculopathy, cervical region: Secondary | ICD-10-CM

## 2018-05-16 NOTE — Therapy (Signed)
[place] [facility] [address] [place], [address] Phone: [phone]   Fax:  [phone]  Physical Therapy Treatment  Patient Details  Name: David Turner MRN: [id number] Date of Birth: 08-Jul-1951 Referring Provider: Clearance Coots   Encounter Date: 05/16/2018  PT End of Session - 05/16/18 0905    Visit Number  3    Number of Visits  8    Date for PT Re-Evaluation  06/04/18    Authorization Type  medicare    Authorization Time Period  2/10    PT Start Time  0901    PT Stop Time  0945    PT Time Calculation (min)  44 min    Activity Tolerance  Patient tolerated treatment well    Behavior During Therapy  [facility] for tasks assessed/performed       Past Medical History:  Diagnosis Date  . Gout   . Hyperlipidemia   . Hypertension     Past Surgical History:  Procedure Laterality Date  . left knee surgeries  1983 1993, 2008    There were no vitals filed for this visit.  Subjective Assessment - 05/16/18 0902    Subjective  Patient reports after therapy he got a bit stiff later in the day, had some pain yesterday in the shoulder, about his usual amount of pain.     Currently in Pain?  No/denies   1 with L rotation       Objective: TTP and trigger points present in L UT and levator scapulae, 50% improvement noted after STM  Therapeutic exercise: Patient performed with instruction, verbal cues, tactile cues of therapist: goal: maintain ROM, improve cervical strength  L levator scap stretches 3x20", pain with R stretches UT stretch 3x20" on L Supine chin tucks with verbal/tactile cues 15x3sec holds Cervical isometrics flexion/rotation 10x3" holds verbal/visual cues    Manual Therapy: 30 min: goal : spasms, pain, improve ROM cervical spine, decrease radicular symptoms Cervical traction with towel with PT x93mins 20 mins STM superficial and deep to B/L UT, levator scap, trigger point release in  supine   Patient response to treatment: Patient reported no pain at end of session, decrease in shoulder pain to 0/10 after manual therapy. Minimal discomfort with cervical stretches.                         PT Education - 05/16/18 0959    Education Details  HEP updated and therex    Person(s) Educated  Patient    Methods  Explanation;Handout    Comprehension  Verbalized understanding;Returned demonstration       PT Short Term Goals - 05/07/18 1716      PT SHORT TERM GOAL #1   Title  Patient will be adherent to HEP at least 3x a week to improve functional strength and ROM at home to indicate ability to self manage condition at home    Time  4    Period  Weeks    Status  New    Target Date  05/21/18        PT Long Term Goals - 05/07/18 1717      PT LONG TERM GOAL #1   Title  Patient will be able to perform household work/ chores without increase in symptoms.    Time  4    Period  Weeks    Status  New    Target Date  06/04/18  PT LONG TERM GOAL #2   Title  Patient will demonstrate adequate cervical ROM and strength to be able to shave and dress independently and turn head while driving with pain less than 2/10.     Time  4    Period  Weeks    Status  New    Target Date  06/04/18      PT LONG TERM GOAL #3   Title  Patient will demonstrate improved ability to perform functional activities with decrease in difficulty indicated by improvement in FOTO score by at least 10 points.    Baseline  FOTO: 59    Time  4    Period  Weeks    Status  New    Target Date  06/04/18            Plan - 05/16/18 0955    Clinical Impression Statement  Patient sensitive to trigger point release of levator/UT in supine today. Had complaints of pain with max cervical rotation isometric to L, modified to submax isometric with decreased pain. HEP updated.    Rehab Potential  Good    Clinical Impairments Affecting Rehab Potential  decreased cervical ROM,  impaired posture, decreased endurance, decreased ability to perform functional activities    PT Frequency  2x / week    PT Duration  4 weeks    PT Treatment/Interventions  Neuromuscular re-education;Manual techniques;Moist Heat;Traction;Therapeutic activities;Patient/family education;Therapeutic exercise;Cryotherapy;Electrical Stimulation;Dry needling;Passive range of motion    PT Next Visit Plan  first rib mob, traction, STM to cervical musculature, update HEP with stretches, chin tucks    PT Home Exercise Plan  cervical rotation in pain free range    Consulted and Agree with Plan of Care  Patient       Patient will benefit from skilled therapeutic intervention in order to improve the following deficits and impairments:  Increased muscle spasms, Decreased activity tolerance, Pain, Hypomobility, Impaired flexibility, Decreased strength, Decreased mobility  Visit Diagnosis: Cervical radiculopathy     Problem List Patient Active Problem List   Diagnosis Date Noted  . Cervical radiculopathy 04/11/2018  . ED (erectile dysfunction) 11/26/2017  . Allergic rhinitis 11/26/2017  . Gout   . Hypertension   . Hyperlipidemia     [name] 05/16/2018, 10:03 AM  [place] [facility] [address] [place], [address] Phone: [phone]   Fax:  [phone]  Name: David Turner MRN: [id number] Date of Birth: 11/20/1950

## 2018-05-16 NOTE — Patient Instructions (Signed)
Access Code: [email or web address]  URL: [email or web address]  Date: 05/16/2018  Prepared by: [name]   Exercises  Seated Isometric Cervical Flexion - 10 reps - 1 sets - 1x daily - 7x weekly  Seated Isometric Cervical Rotation - 10 reps - 1 sets - 1x daily - 7x weekly  Supine Chin Tuck - 10 reps - 1 sets - 1x daily - 7x weekly  Neck Rotation - 10 reps - 1 sets - 1x daily - 7x weekly

## 2018-05-21 ENCOUNTER — Ambulatory Visit: Payer: Medicare Other

## 2018-05-21 DIAGNOSIS — M5412 Radiculopathy, cervical region: Secondary | ICD-10-CM | POA: Diagnosis not present

## 2018-05-21 NOTE — Therapy (Signed)
[facility] [address], [place], [address] Phone: [phone]   Fax:  [phone]  Physical Therapy Treatment  Patient Details  Name: David Turner MRN: [id number] Date of Birth: 23-Jun-1951 Referring Provider: Clearance Coots   Encounter Date: 05/21/2018  PT End of Session - 05/21/18 1120    Visit Number  4    Number of Visits  8    Date for PT Re-Evaluation  06/04/18    Authorization Type  medicare    Authorization Time Period  3/10    PT Start Time  1033    PT Stop Time  1117    PT Time Calculation (min)  44 min    Activity Tolerance  Patient tolerated treatment well    Behavior During Therapy  [facility] for tasks assessed/performed       Past Medical History:  Diagnosis Date  . Gout   . Hyperlipidemia   . Hypertension     Past Surgical History:  Procedure Laterality Date  . left knee surgeries  1983 1993, 2008     Subjective Assessment - 05/21/18 1035    Subjective  Patient reports no pain at rest. States he does has some minimal pain with max cervical rotation to L, 2/10.    Pertinent History  Patient reports that first incidence of neck pain started 2010 while attempting to dive into water. States the radicular symptoms started after that. Most recent flare up started about 5 months ago. He was carrying a heavy box and he twisted, got significant shoulder/neck pain and it has been irritated since then. Reports that it has improved since initial onset, but still bothersome especially with shoulder movements/carrying items/turning his head. Reports that when he initially saw his doctor it was on the L, now it includes the right side.Marland Kitchen Has had an injection that eased off some symptoms. Feels his pain has interupted his ability to perform functional activities due to flare ups.    Pain Score  2     Pain Orientation  Left    Pain Descriptors / Indicators  Tightness;Aching    Pain Type  Chronic pain    Pain Onset  More than a month ago      Objective:TTP and trigger points present in L/R UT and levator scapulae.  Therapeutic exercise: Patient performed with instruction, verbal cues, tactile cues of therapist: goal: maintain ROM, improve cervical strength, improve scapular strength  L levator scap stretches 3x30" UT stretch 3x30" Chin tucks with verbal/tactile cues 20x3sec holds Cervical isometrics flexion/rotation 10x3" holds verbal/visual cues Scapular retractions with RTB and tactile/visual cues x15   Manual Therapy:66min: goal : spasms, pain, improve ROM cervical spine, decrease radicular symptoms Cervical traction with towel with PTx29mins 20 mins STM superficial and deep to B/L UT, levator scap, trigger point release in supine   Patient response to treatment:Patient reported no increased pain at end of session. Improvement in soft tissue integrity by 50% after STM. Patient has minimal complaints with b/l cervical stretches, unclear if it causes concordant pain or is just a stretch per patient.      PT Education - 05/21/18 1108    Education Details  HEP updated for home use    Person(s) Educated  Patient    Methods  Explanation;Handout    Comprehension  Verbalized understanding;Returned demonstration       PT Short Term Goals - 05/07/18 1716      PT SHORT TERM GOAL #1  Title  Patient will be adherent to HEP at least 3x a week to improve functional strength and ROM at home to indicate ability to self manage condition at home    Time  4    Period  Weeks    Status  New    Target Date  05/21/18        PT Long Term Goals - 05/07/18 1717      PT LONG TERM GOAL #1   Title  Patient will be able to perform household work/ chores without increase in symptoms.    Time  4    Period  Weeks    Status  New    Target Date  06/04/18      PT LONG TERM GOAL #2   Title  Patient will demonstrate adequate cervical ROM and strength to be able to shave and dress  independently and turn head while driving with pain less than 2/10.     Time  4    Period  Weeks    Status  New    Target Date  06/04/18      PT LONG TERM GOAL #3   Title  Patient will demonstrate improved ability to perform functional activities with decrease in difficulty indicated by improvement in FOTO score by at least 10 points.    Baseline  FOTO: 59    Time  4    Period  Weeks    Status  New    Target Date  06/04/18            Plan - 05/21/18 1107    Clinical Impression Statement  Patient reports mild improvement in stiffness post STM today. Patient continues to exhibit improvement in cervical ROM and soft tissue integrity. The patient would benefit from further skilled PT to continue to progress towards goals.    PT Frequency  2x / week    PT Duration  4 weeks    PT Treatment/Interventions  Neuromuscular re-education;Manual techniques;Moist Heat;Traction;Therapeutic activities;Patient/family education;Therapeutic exercise;Cryotherapy;Electrical Stimulation;Dry needling;Passive range of motion    PT Home Exercise Plan  cervical rotation in pain free range, Ut/levator stretch, isometrics [id number]     Consulted and Agree with Plan of Care  Patient       Patient will benefit from skilled therapeutic intervention in order to improve the following deficits and impairments:  Increased muscle spasms, Decreased activity tolerance, Pain, Hypomobility, Impaired flexibility, Decreased strength, Decreased mobility  Visit Diagnosis: Cervical radiculopathy     Problem List Patient Active Problem List   Diagnosis Date Noted  . Cervical radiculopathy 04/11/2018  . ED (erectile dysfunction) 11/26/2017  . Allergic rhinitis 11/26/2017  . Gout   . Hypertension   . Hyperlipidemia     [name] PT, DPT 11:21 AM,05/21/18 [facility] [address] [place], [address] Phone: [phone]    Fax:  [phone]  Name: David Turner MRN: [id number] Date of Birth: 1950/10/16

## 2018-05-23 ENCOUNTER — Ambulatory Visit: Payer: Medicare Other

## 2018-05-23 DIAGNOSIS — M5412 Radiculopathy, cervical region: Secondary | ICD-10-CM | POA: Diagnosis not present

## 2018-05-23 NOTE — Patient Instructions (Signed)
Scapular retractions and scapular retractions plus with RTB x15 once daily added to HEP.

## 2018-05-23 NOTE — Therapy (Signed)
[facility] [address] [place], [address] Phone: [phone]   Fax:  [phone]  Physical Therapy Treatment  Patient Details  Name: David Turner MRN: [id number] Date of Birth: May 29, 1951 Referring Provider: Clearance Coots   Encounter Date: 05/23/2018  PT End of Session - 05/23/18 1114    Visit Number  5    Number of Visits  8    Date for PT Re-Evaluation  06/04/18    Authorization Type  medicare    Authorization Time Period  4/10    PT Start Time  1030    PT Stop Time  1110    PT Time Calculation (min)  40 min    Activity Tolerance  Patient tolerated treatment well    Behavior During Therapy  [facility] for tasks assessed/performed       Past Medical History:  Diagnosis Date  . Gout   . Hyperlipidemia   . Hypertension     Past Surgical History:  Procedure Laterality Date  . left knee surgeries  1983 1993, 2008    There were no vitals filed for this visit.  Subjective Assessment - 05/23/18 1032    Subjective  Patient reports that yesterday was a good day. Reports that he does his exercises at home when he feels his pain, but didn't do them yesterday because he wasn't feeling pain.     Currently in Pain?  No/denies    Pain Onset  More than a month ago         Objective:TTP and trigger points present in L/R UT and levator scapulae.  Therapeutic exercise: Patient performed with instruction, verbal cues, tactile cues of therapist: goal: maintain ROM, improve cervical strength, improve scapular strength  L levator scap stretches 3x30" visual cues UT stretch 3x30" visual cues Chin tucks with verbal/tactile cues15x3sec holds tactile cues Chin tucks with isometric push x10  And 3 sec holds tactile cues Scapular retractions with RTB and tactile/visual cues x15 (added to HEP) Scapular retractions plus with RTB and tactile/visual cues x15 (added to HEP) Scapular wall slides with pillow case,  shoulders retracted throughout motion x10 tactile cues throughout   Manual Therapy:77min: goal : spasms, pain, improve ROM cervical spine, decrease radicular symptoms Cervical traction with towel with PTx16mins 62mins STM superficial and deep toB/LUT, levator scap, trigger point release in supine   Patient response to treatment: Improvement in soft tissue integrity by 50% after STM. No complaints at end of session.   PT Education - 05/23/18 1033    Education Details  Therex technique/form, HEP    Person(s) Educated  Patient    Methods  Explanation;Tactile cues;Verbal cues;Demonstration    Comprehension  Verbalized understanding;Returned demonstration       PT Short Term Goals - 05/23/18 1112      PT SHORT TERM GOAL #1   Title  Patient will be adherent to HEP at least 3x a week to improve functional strength and ROM at home to indicate ability to self manage condition at home    Time  4    Period  Weeks    Status  Achieved    Target Date  05/21/18        PT Long Term Goals - 05/07/18 1717      PT LONG TERM GOAL #1   Title  Patient will be able to perform household work/ chores without increase in symptoms.    Time  4    Period  Weeks  Status  New    Target Date  06/04/18      PT LONG TERM GOAL #2   Title  Patient will demonstrate adequate cervical ROM and strength to be able to shave and dress independently and turn head while driving with pain less than 2/10.     Time  4    Period  Weeks    Status  New    Target Date  06/04/18      PT LONG TERM GOAL #3   Title  Patient will demonstrate improved ability to perform functional activities with decrease in difficulty indicated by improvement in FOTO score by at least 10 points.    Baseline  FOTO: 59    Time  4    Period  Weeks    Status  New    Target Date  06/04/18            Plan - 05/23/18 1112    Clinical Impression Statement  Patient demonstrates improved cervical rotation from prevoius  sessions at start of session with decreased pain. soft tissue integrity also improved from previous visits. During session patient exhibits shoulder hiking throughout exercises, needed tactile/verbal cues to address.     PT Frequency  2x / week    PT Duration  4 weeks    PT Treatment/Interventions  Neuromuscular re-education;Manual techniques;Moist Heat;Traction;Therapeutic activities;Patient/family education;Therapeutic exercise;Cryotherapy;Electrical Stimulation;Dry needling;Passive range of motion    PT Next Visit Plan  scapular strengthening, STM    PT Home Exercise Plan  cervical rotation in pain free range, Ut/levator stretch, isometrics [id number]     Consulted and Agree with Plan of Care  Patient       Patient will benefit from skilled therapeutic intervention in order to improve the following deficits and impairments:  Increased muscle spasms, Decreased activity tolerance, Pain, Hypomobility, Impaired flexibility, Decreased strength, Decreased mobility  Visit Diagnosis: Cervical radiculopathy     Problem List Patient Active Problem List   Diagnosis Date Noted  . Cervical radiculopathy 04/11/2018  . ED (erectile dysfunction) 11/26/2017  . Allergic rhinitis 11/26/2017  . Gout   . Hypertension   . Hyperlipidemia     [name] PT, DPT 11:15 AM,05/23/18 [phone]  [facility] [address] [place], [address] Phone: [phone]   Fax:  [phone]  Name: Norah Fick MRN: [id number] Date of Birth: [birth year]

## 2018-06-04 ENCOUNTER — Ambulatory Visit: Payer: Medicare Other

## 2018-06-04 DIAGNOSIS — M5412 Radiculopathy, cervical region: Secondary | ICD-10-CM

## 2018-06-04 NOTE — Patient Instructions (Signed)
Access Code: [email or web address]  URL: [email or web address]  Date: 06/04/2018  Prepared by: [name]   Exercises  Supine Chin Tuck - 15 reps - 1 sets - 3 hold - 1x daily - 7x weekly  Neck Retraction - 14 reps - 3 hold - 1x daily - 7x weekly  Seated Isometric Cervical Flexion - 15 reps - 1 sets - 3 hold - 1x daily - 7x weekly  Seated Isometric Cervical Rotation - 15 reps - 1 sets - 3 hold - 1x daily - 7x weekly  Seated Levator Scapulae Stretch - 3 reps - 1 sets - 30 hold - 1x daily - 7x weekly  Seated Upper Trapezius Stretch - 3 reps - 1 sets - 30 hold - 1x daily - 7x weekly  Scapular Retraction with Resistance - 10 reps - 3 sets - 1x daily - 7x weekly  Scapular Retraction with Resistance Advanced - 10 reps - 3 sets - 1x daily - 7x weekly  Scapular Wall Slides - 10 reps - 2 sets - 1x daily - 7x weekly  Low Trap Setting at Wall - 10 reps - 2 sets - 1x daily - 7x weekly

## 2018-06-04 NOTE — Therapy (Signed)
[facility] [address] [place], [address] Phone: [phone]   Fax:  [phone]  Physical Therapy Treatment  Patient Details  Name: David Turner MRN: [id number] Date of Birth: [birth year] Referring Provider: Clearance Coots   Encounter Date: 06/04/2018  PT End of Session - 06/04/18 1034    Visit Number  6    Number of Visits  8    Date for PT Re-Evaluation  06/04/18    Authorization Type  medicare    Authorization Time Period  5/10    PT Start Time  1032    PT Stop Time  1117    PT Time Calculation (min)  45 min    Activity Tolerance  Patient tolerated treatment well    Behavior During Therapy  [facility] for tasks assessed/performed       Past Medical History:  Diagnosis Date  . Gout   . Hyperlipidemia   . Hypertension     Past Surgical History:  Procedure Laterality Date  . left knee surgeries  1983 1993, 2008    There were no vitals filed for this visit.  Subjective Assessment - 06/04/18 1035    Subjective  Patient reports that overall therapy is going well, his pain is a lot better than it was. He feels 80% improvement, states his motion, functional activities and the pain have much improved. Reports that he is still feeling his pain first thing in the morning, and some tightness in neck with end range flexion.     Pertinent History  Patient reports that first incidence of neck pain started 2010 while attempting to dive into water. States the radicular symptoms started after that. Most recent flare up started about 5 months ago. He was carrying a heavy box and he twisted, got significant shoulder/neck pain and it has been irritated since then. Reports that it has improved since initial onset, but still bothersome especially with shoulder movements/carrying items/turning his head. Reports that when he initially saw his doctor it was on the L, now it includes the right side.Marland Kitchen Has had an injection that  eased off some symptoms. Feels his pain has interupted his ability to perform functional activities due to flare ups.    Limitations  Reading;Lifting;Other (comment)   cervical rotation/SB   Patient Stated Goals  pain relief, strengthening    Currently in Pain?  No/denies    Pain Score  --   best: 0 worst: 6   Pain Location  Neck    Pain Orientation  Left;Posterior;Proximal;Upper    Pain Type  Chronic pain    Pain Radiating Towards  patient reports that is has not radiated in the last month. Reports that it only happens with a swimming movtion, radiated to both UE/thumbs    Pain Onset  More than a month ago    Pain Frequency  Intermittent    Aggravating Factors   cervical rotation, especially to L    Pain Relieving Factors  rest, HEP, some self soft tissue mobilization      Objective:TTP of present in posterior cervical paraspinals, suboccipital muscles, levator scapulae (of L).   Therapeutic exercise: Patient performed with instruction, verbal cues, tactile cues of therapist: goal: maintain ROM, improve cervical strength, improve scapular strength   Scapular retractions with GTB and tactile/visual cues x15 (added to HEP) Scapular retractions plus with GTB and tactile/visual cues x15 (added to HEP) Scapular wall slides with YTB shoulders retracted throughout motion 2 x10 tactile cues  throughout Lower trap lift offs with YTB with tactile/verbal cues 2 x10   Manual Therapy:27min: goal : spasms, pain, improve ROM cervical spine, decrease radicular symptoms STM superficial and deep toB/L levator scap, trigger point release in sitting   Patient response to treatment: Improvement in soft tissue integrity by 50% after STM. No complaints at end of session.   [facility] PT Assessment - 06/04/18 0001      Assessment   Referring Provider  Clearance Coots    Onset Date/Surgical Date  12/20/17      AROM   AROM Assessment Site  Cervical;Shoulder    Right/Left Shoulder  --   WFLs no  complaints of pain   Cervical Flexion  50    Cervical Extension  45    Cervical - Right Side Bend  30    Cervical - Left Side Bend  40    Cervical - Right Rotation  50    Cervical - Left Rotation  50      Strength   Overall Strength  --   mild deficits in scapular strength, grossly 4-/5     Palpation   Palpation comment  --   tension present in L UT, levator scapulae, cervical paraspin       PT Education - 06/04/18 1041    Education Details  condition, POC    Person(s) Educated  Patient    Methods  Explanation;Demonstration;Tactile cues;Verbal cues;Handout    Comprehension  Verbalized understanding;Returned demonstration       PT Short Term Goals - 05/23/18 1112      PT SHORT TERM GOAL #1   Title  Patient will be adherent to HEP at least 3x a week to improve functional strength and ROM at home to indicate ability to self manage condition at home    Time  4    Period  Weeks    Status  Achieved    Target Date  05/21/18        PT Long Term Goals - 06/04/18 1041      PT LONG TERM GOAL #1   Title  Patient will be able to perform household work/ chores without increase in symptoms.    Time  4    Period  Weeks    Status  Achieved    Target Date  06/04/18      PT LONG TERM GOAL #2   Title  Patient will demonstrate adequate cervical ROM and strength to be able to shave and dress independently and turn head while driving with pain less than 2/10.     Time  4    Period  Weeks    Status  Achieved    Target Date  06/04/18      PT LONG TERM GOAL #3   Title  Patient will demonstrate improved ability to perform functional activities with decrease in difficulty indicated by improvement in FOTO score by at least 10 points.    Baseline  FOTO: 59: FOTO: 72     Time  --    Period  --    Status  Achieved    Target Date  06/04/18      PT LONG TERM GOAL #4   Title  Patient will report pain first thing in the morning with 2/10 or less at least 50% of the time.    Time  2     Period  Weeks    Status  New    Target Date  06/18/18  PT LONG TERM GOAL #5   Title  Patient will demonstrate normal soft tissue integrity of diffuse L upper quadrant to improve pain/ease symptoms.    Time  2    Period  Weeks    Status  New    Target Date  06/18/18            Plan - 06/04/18 1052    Clinical Impression Statement  Upon assessment patient demontrates improvement in cervical and shoulder ROM, minimal complaints of pain with lateral flexion. MMT of b/l shoulders WFLs, mild deficits of scapular strength. The patient would benefit from further skilled PT to continue to address deficits in soft tissue integrity, futher pain management, and to strength scapular muscles to improve ability to perform functional activities.    Clinical Impairments Affecting Rehab Potential  decreased cervical ROM, impaired posture, decreased endurance, decreased ability to perform functional activities    PT Frequency  2x / week    PT Duration  2 weeks    PT Treatment/Interventions  Neuromuscular re-education;Manual techniques;Moist Heat;Traction;Therapeutic activities;Patient/family education;Therapeutic exercise;Cryotherapy;Electrical Stimulation;Dry needling;Passive range of motion;Spinal Manipulations;Joint Manipulations    PT Home Exercise Plan  cervical rotation in pain free range, Ut/levator stretch, isometrics [id number]     Consulted and Agree with Plan of Care  Patient       Patient will benefit from skilled therapeutic intervention in order to improve the following deficits and impairments:  Increased muscle spasms, Decreased activity tolerance, Pain, Hypomobility, Impaired flexibility, Decreased strength, Decreased mobility  Visit Diagnosis: Cervical radiculopathy     Problem List Patient Active Problem List   Diagnosis Date Noted  . Cervical radiculopathy 04/11/2018  . ED (erectile dysfunction) 11/26/2017  . Allergic rhinitis 11/26/2017  . Gout   . Hypertension   .  Hyperlipidemia    [name] PT, DPT 1:00 PM,06/04/18 [phone]  [facility] [address] [place], [address] Phone: [phone]   Fax:  [phone]  Name: David Turner MRN: [id number] Date of Birth: [birth year]

## 2018-06-06 ENCOUNTER — Ambulatory Visit: Payer: Medicare Other

## 2018-06-06 DIAGNOSIS — M5412 Radiculopathy, cervical region: Secondary | ICD-10-CM

## 2018-06-06 NOTE — Therapy (Signed)
[facility] [address] [place], [address] Phone: [phone]   Fax:  [phone]  Physical Therapy Treatment  Patient Details  Name: David Turner MRN: [id number] Date of Birth: 1951/06/17 Referring Provider: Clearance Coots   Encounter Date: 06/06/2018  PT End of Session - 06/06/18 1033    Visit Number  7    Number of Visits  8    Date for PT Re-Evaluation  06/18/18    Authorization Type  medicare    Authorization Time Period  6/10    PT Start Time  1034    PT Stop Time  1114    PT Time Calculation (min)  40 min    Activity Tolerance  Patient tolerated treatment well    Behavior During Therapy  [facility] for tasks assessed/performed       Past Medical History:  Diagnosis Date  . Gout   . Hyperlipidemia   . Hypertension     Past Surgical History:  Procedure Laterality Date  . left knee surgeries  1983 1993, 2008    There were no vitals filed for this visit.  Subjective Assessment - 06/06/18 1035    Subjective  Patient reports that things are going well, has no pain at start of session. Minimal complaints of tightness with cervical rotation to the L.    Currently in Pain?  No/denies    Pain Onset  More than a month ago       Objective:mild TTP of present in posterior cervical paraspinals, suboccipital muscles, levator scapulae (of L).   Therapeutic exercise: Patient performed with instruction, verbal cues, tactile cues of therapist: goal: maintain ROM, improve cervical strength, improve scapular strength  seated thoracic extension x15 with pause at top, demo from PT for form Scapular retractions with GTB and tactile/visual cues x20 Scapular retractions plus with GTB and tactile/visual cues 2x10 Scapular wall slides with YTB shoulders retracted throughout motion 2 x10 tactile cues throughout Lower trap lift offs with YTB with verbal cues 2 x10  Doorway stretch 3 x 30s  Half quadruped rows  with 5# weights Serratus punches with 5# dumbbells 2x10 Prone lower trap lift offs 2x5  (add ER strengthening,Wall push up with plus,  next visit)  Manual Therapy:41min: goal : spasms, pain, improve ROM cervical spine, decrease radicular symptoms STM superficial and trigger point release toB/L levator scap   Patient response to treatment:Improvement in soft tissue integrity by 50% after STM. No complaints of pain at end of session.   PT Education - 06/06/18 1114    Education Details  therex technique/form    Person(s) Educated  Patient    Methods  Explanation;Demonstration;Tactile cues;Verbal cues    Comprehension  Verbalized understanding;Returned demonstration       PT Short Term Goals - 05/23/18 1112      PT SHORT TERM GOAL #1   Title  Patient will be adherent to HEP at least 3x a week to improve functional strength and ROM at home to indicate ability to self manage condition at home    Time  4    Period  Weeks    Status  Achieved    Target Date  05/21/18        PT Long Term Goals - 06/04/18 1041      PT LONG TERM GOAL #1   Title  Patient will be able to perform household work/ chores without increase in symptoms.    Time  4  Period  Weeks    Status  Achieved    Target Date  06/04/18      PT LONG TERM GOAL #2   Title  Patient will demonstrate adequate cervical ROM and strength to be able to shave and dress independently and turn head while driving with pain less than 2/10.     Time  4    Period  Weeks    Status  Achieved    Target Date  06/04/18      PT LONG TERM GOAL #3   Title  Patient will demonstrate improved ability to perform functional activities with decrease in difficulty indicated by improvement in FOTO score by at least 10 points.    Baseline  FOTO: 59: FOTO: 72     Time  --    Period  --    Status  Achieved    Target Date  06/04/18      PT LONG TERM GOAL #4   Title  Patient will report pain first thing in the morning with 2/10 or less  at least 50% of the time.    Time  2    Period  Weeks    Status  New    Target Date  06/18/18      PT LONG TERM GOAL #5   Title  Patient will demonstrate normal soft tissue integrity of diffuse L upper quadrant to improve pain/ease symptoms.    Time  2    Period  Weeks    Status  New    Target Date  06/18/18            Plan - 06/06/18 1115    Clinical Impression Statement  Patient still has most difficulty with scapular retractions. Difficulty avoiding compensational movements such as shoulder hikes, tactile/visual cues to maintain correct formed needed. The patient had no complaints of pain during session just fatigue.     PT Frequency  2x / week    PT Duration  2 weeks    PT Treatment/Interventions  Neuromuscular re-education;Manual techniques;Moist Heat;Traction;Therapeutic activities;Patient/family education;Therapeutic exercise;Cryotherapy;Electrical Stimulation;Dry needling;Passive range of motion;Spinal Manipulations;Joint Manipulations    Consulted and Agree with Plan of Care  Patient       Patient will benefit from skilled therapeutic intervention in order to improve the following deficits and impairments:     Visit Diagnosis: Cervical radiculopathy     Problem List Patient Active Problem List   Diagnosis Date Noted  . Cervical radiculopathy 04/11/2018  . ED (erectile dysfunction) 11/26/2017  . Allergic rhinitis 11/26/2017  . Gout   . Hypertension   . Hyperlipidemia     [name] PT, DPT 11:17 AM,06/06/18 [phone]  [facility] [address], [place], [address] Phone: [phone]   Fax:  [phone]  Name: David Turner MRN: [id number] Date of Birth: October 22, 1950

## 2018-06-11 ENCOUNTER — Ambulatory Visit: Payer: Medicare Other

## 2018-06-13 ENCOUNTER — Ambulatory Visit: Payer: Medicare Other | Attending: Family Medicine

## 2018-06-13 DIAGNOSIS — M5412 Radiculopathy, cervical region: Secondary | ICD-10-CM | POA: Insufficient documentation

## 2018-06-13 NOTE — Therapy (Signed)
[facility] [address], [place], [address] Phone: [phone]   Fax:  [phone]  Physical Therapy Treatment  Patient Details  Name: David Turner MRN: [id number] Date of Birth: 10-16-1950 Referring Provider (PT): Clearance Coots   Encounter Date: 06/13/2018  PT End of Session - 06/13/18 1647    Visit Number  8    Date for PT Re-Evaluation  06/18/18    PT Start Time  1645    Activity Tolerance  Patient tolerated treatment well    Behavior During Therapy  [facility] for tasks assessed/performed       Past Medical History:  Diagnosis Date  . Gout   . Hyperlipidemia   . Hypertension     Past Surgical History:  Procedure Laterality Date  . left knee surgeries  1983 1993, 2008    There were no vitals filed for this visit.  Subjective Assessment - 06/13/18 1646    Subjective  Patient reports that he has some stiffness at the start of session. Patient reported no pain at end of last session.     Currently in Pain?  No/denies          Therapeutic exercise: Patient performed with instruction, verbal cues, tactile cues of therapist: goal: maintain ROM, improve cervical strength, improve scapular strength  seated thoracic extension x15 with pause at top, demo from PT for form Scapular retractions withBTB and tactile/visual cues x20 Scapular retractions plus withBTB and tactile/visual cues 2x10 Scapular wall slides withYTBshoulders retracted throughout motion 2x10 tactile cues throughout Lower trap lift offs with YTB with verbal cues 2 x10 Doorway stretch 3 x 30s  Half quadruped rows with 7# weights 2x10 Prone lower trap lift offs 2x5 Counter push up with serratus activation 2x10 ER with RTB in 90/90 2x10 b/l     Patient response to treatment:Improvement in exercise technique and form with verbal/tactile cues throughout session.    PT Education - 06/13/18 1646    Education Details   therex technique/form    Person(s) Educated  Patient    Methods  Explanation;Demonstration;Tactile cues    Comprehension  Verbalized understanding       PT Short Term Goals - 05/23/18 1112      PT SHORT TERM GOAL #1   Title  Patient will be adherent to HEP at least 3x a week to improve functional strength and ROM at home to indicate ability to self manage condition at home    Time  4    Period  Weeks    Status  Achieved    Target Date  05/21/18        PT Long Term Goals - 06/04/18 1041      PT LONG TERM GOAL #1   Title  Patient will be able to perform household work/ chores without increase in symptoms.    Time  4    Period  Weeks    Status  Achieved    Target Date  06/04/18      PT LONG TERM GOAL #2   Title  Patient will demonstrate adequate cervical ROM and strength to be able to shave and dress independently and turn head while driving with pain less than 2/10.     Time  4    Period  Weeks    Status  Achieved    Target Date  06/04/18      PT LONG TERM GOAL #3   Title  Patient will demonstrate improved ability  to perform functional activities with decrease in difficulty indicated by improvement in FOTO score by at least 10 points.    Baseline  FOTO: 59: FOTO: 72     Time  --    Period  --    Status  Achieved    Target Date  06/04/18      PT LONG TERM GOAL #4   Title  Patient will report pain first thing in the morning with 2/10 or less at least 50% of the time.    Time  2    Period  Weeks    Status  New    Target Date  06/18/18      PT LONG TERM GOAL #5   Title  Patient will demonstrate normal soft tissue integrity of diffuse L upper quadrant to improve pain/ease symptoms.    Time  2    Period  Weeks    Status  New    Target Date  06/18/18            Plan - 06/13/18 1712    Clinical Impression Statement  Patient continues to demonstrate improvement in b/l UE strength and postural awareness. No complaints of pain during session. Patient still needs  visual/tactile cues to optimize scapular retractions.     PT Frequency  2x / week    PT Duration  2 weeks    PT Treatment/Interventions  Neuromuscular re-education;Manual techniques;Moist Heat;Traction;Therapeutic activities;Patient/family education;Therapeutic exercise;Cryotherapy;Electrical Stimulation;Dry needling;Passive range of motion;Spinal Manipulations;Joint Manipulations    PT Next Visit Plan  scapular strengthening, STM    PT Home Exercise Plan  cervical rotation in pain free range, Ut/levator stretch, isometrics [id number]     Consulted and Agree with Plan of Care  Patient       Patient will benefit from skilled therapeutic intervention in order to improve the following deficits and impairments:  Increased muscle spasms, Decreased activity tolerance, Pain, Hypomobility, Impaired flexibility, Decreased strength, Decreased mobility  Visit Diagnosis: Cervical radiculopathy     Problem List Patient Active Problem List   Diagnosis Date Noted  . Cervical radiculopathy 04/11/2018  . ED (erectile dysfunction) 11/26/2017  . Allergic rhinitis 11/26/2017  . Gout   . Hypertension   . Hyperlipidemia     [name] PT, DPT 5:25 PM,06/13/18 [phone]  [facility] [address], [place], [address] Phone: [phone]   Fax:  [phone]  Name: David Turner MRN: [id number] Date of Birth: 05-17-1951

## 2018-06-18 ENCOUNTER — Ambulatory Visit: Payer: Medicare Other

## 2018-06-18 DIAGNOSIS — M5412 Radiculopathy, cervical region: Secondary | ICD-10-CM | POA: Diagnosis not present

## 2018-06-18 NOTE — Therapy (Signed)
[facility] [address] [place], [address] Phone: [phone]   Fax:  [phone]  Physical Therapy Treatment and Discharge Note  Patient Details  Name: David Turner MRN: [id number] Date of Birth: [birth year] Referring Provider (PT): Clearance Coots   Encounter Date: 06/18/2018  PT End of Session - 06/18/18 1347    Visit Number  9    Date for PT Re-Evaluation  06/18/18    PT Start Time  1300    PT Stop Time  1340    PT Time Calculation (min)  40 min    Activity Tolerance  Patient tolerated treatment well    Behavior During Therapy  [facility] for tasks assessed/performed       Past Medical History:  Diagnosis Date  . Gout   . Hyperlipidemia   . Hypertension     Past Surgical History:  Procedure Laterality Date  . left knee surgeries  1983 1993, 2008    There were no vitals filed for this visit.  Subjective Assessment - 06/18/18 1344    Subjective  Patient reports no pain at start of session. Has some complaints of stiffness in the morning that resolves in 15-20 minutes.    Currently in Pain?  No/denies    Aggravating Factors   cervical rotation, sleeping position    Pain Relieving Factors  rest, HEP, STM         [facility] PT Assessment - 06/18/18 0001      AROM   Cervical Flexion  50    Cervical Extension  45    Cervical - Right Side Bend  30    Cervical - Left Side Bend  30    Cervical - Right Rotation  50    Cervical - Left Rotation  50      Strength   Overall Strength  Within functional limits for tasks performed      Palpation   Palpation comment  --   non tender to palpation       see objective measures above.   Doorway Pec Stretch at 120 Degrees Abduction 2x30s  Seated Thoracic Extension with Hands Behind Neck - 10 reps  Scapular Retraction with Resistance - 10 reps  Scapular Retraction with Resistance Advanced 10 reps Scapular Wall Slides - 10 reps with RTB  Low Trap Setting  at [place] - 10 reps with RTB Push Up on Table - 10 reps with serratus activation Standing Bent Over Single Arm Scapular Row with Table Support - 10 reps b/l with 7# dumbbell Prone Shoulder Extension - 10 reps with 2# dumbbell Prone Single Arm Shoulder Y 2x5 with 2# dumbbell PT and patient reviewed HEP and importance of compliance.    PT Education - 06/18/18 1345    Education Details  therex technique/form, POC, HEP    Person(s) Educated  Patient    Methods  Explanation;Demonstration;Tactile cues;Handout    Comprehension  Verbalized understanding;Returned demonstration       PT Short Term Goals - 05/23/18 1112      PT SHORT TERM GOAL #1   Title  Patient will be adherent to HEP at least 3x a week to improve functional strength and ROM at home to indicate ability to self manage condition at home    Time  4    Period  Weeks    Status  Achieved    Target Date  05/21/18        PT Long Term Goals - 06/18/18  [place] #1   Title  Patient will be able to perform household work/ chores without increase in symptoms.    Time  4    Period  Weeks    Status  Achieved    Target Date  06/18/18      PT LONG TERM GOAL #2   Title  Patient will demonstrate adequate cervical ROM and strength to be able to shave and dress independently and turn head while driving with pain less than 2/10.     Baseline  WFLs    Time  4    Period  Weeks    Status  Achieved    Target Date  06/18/18      PT LONG TERM GOAL #3   Title  Patient will demonstrate improved ability to perform functional activities with decrease in difficulty indicated by improvement in FOTO score by at least 10 points.    Baseline  FOTO: 59: FOTO: 72    Status  Achieved    Target Date  06/18/18      PT LONG TERM GOAL #4   Title  Patient will report pain first thing in the morning with 2/10 or less at least 50% of the time.    Period  Weeks    Status  Achieved    Target Date  06/18/18      PT LONG TERM GOAL #5    Title  Patient will demonstrate normal soft tissue integrity of diffuse L upper quadrant to improve pain/ease symptoms.    Time  2    Period  Weeks    Status  Achieved            Plan - 06/18/18 1345    Clinical Impression Statement  Patient reports 80-90% improvement from initial evaluation. Main complaint is some stiffness/tightness in the AM that resolves in 15-20minutes. Upon assessment patient demonstrates cervical and shoulder ROM and strength WFLs, and greatly improved soft tissue integrity of L upper quadrant. At this time pt and PT reviewed HEP and patient agreeable to discharge.    PT Treatment/Interventions  Neuromuscular re-education;Manual techniques;Moist Heat;Traction;Therapeutic activities;Patient/family education;Therapeutic exercise;Cryotherapy;Electrical Stimulation;Dry needling;Passive range of motion;Spinal Manipulations;Joint Manipulations    PT Home Exercise Plan  [id number]     Consulted and Agree with Plan of Care  Patient       Patient will benefit from skilled therapeutic intervention in order to improve the following deficits and impairments:  Pain  Visit Diagnosis: Cervical radiculopathy     Problem List Patient Active Problem List   Diagnosis Date Noted  . Cervical radiculopathy 04/11/2018  . ED (erectile dysfunction) 11/26/2017  . Allergic rhinitis 11/26/2017  . Gout   . Hypertension   . Hyperlipidemia     [name] PT, DPT 1:51 PM,06/18/18 [facility] [address], [place], [address] Phone: [phone]   Fax:  [phone]  Name: David Turner MRN: [id number] Date of Birth: 1950/10/08

## 2018-06-18 NOTE — Patient Instructions (Signed)
Access Code: [email or web address]  URL: [email or web address]  Date: 06/18/2018  Prepared by: [name]   Exercises  Neck Retraction - 14 reps - 3 hold - 1x daily - 7x weekly  Seated Levator Scapulae Stretch - 3 reps - 1 sets - 30 hold - 1x daily - 7x weekly  Doorway Pec Stretch at 120 Degrees Abduction - 3 reps - 1 sets - 30 hold - 1x daily - 7x weekly  Seated Thoracic Extension with Hands Behind Neck - 10 reps - 2 sets - 1x daily - 7x weekly  Seated Upper Trapezius Stretch - 3 reps - 1 sets - 30 hold - 1x daily - 7x weekly  Scapular Retraction with Resistance - 10 reps - 3 sets - 1x daily - 3x weekly  Scapular Retraction with Resistance Advanced - 10 reps - 3 sets - 1x daily - 3x weekly  Scapular Wall Slides - 10 reps - 2 sets - 1x daily - 3x weekly  Low Trap Setting at [place] - 10 reps - 2 sets - 1x daily - 3x weekly  Push Up on Table - 10 reps - 2 sets - 1x daily - 3x weekly  Standing Bent Over Single Arm Scapular Row with Table Support - 10 reps - 2 sets - 1x daily - 3x weekly  Prone Shoulder Extension - 10 reps - 3 sets - 1x daily - 3x weekly  Prone Single Arm Shoulder Y - 10 reps - 3 sets - 1x daily - 3x weekly  Seated Isometric Cervical Rotation - 15 reps - 1 sets - 3 hold - 1x daily - 7x weekly

## 2018-06-20 ENCOUNTER — Ambulatory Visit: Payer: Medicare Other

## 2018-08-17 ENCOUNTER — Other Ambulatory Visit: Payer: Self-pay | Admitting: Family Medicine

## 2018-10-01 DIAGNOSIS — H40003 Preglaucoma, unspecified, bilateral: Secondary | ICD-10-CM | POA: Diagnosis not present

## 2018-10-07 DIAGNOSIS — Z961 Presence of intraocular lens: Secondary | ICD-10-CM | POA: Diagnosis not present

## 2018-11-15 ENCOUNTER — Other Ambulatory Visit: Payer: Self-pay | Admitting: Family Medicine

## 2018-11-22 DIAGNOSIS — H2512 Age-related nuclear cataract, left eye: Secondary | ICD-10-CM | POA: Diagnosis not present

## 2018-12-11 ENCOUNTER — Ambulatory Visit: Admit: 2018-12-11 | Payer: Medicare Other | Admitting: Ophthalmology

## 2018-12-11 SURGERY — PHACOEMULSIFICATION, CATARACT, WITH IOL INSERTION
Anesthesia: Topical | Laterality: Left

## 2019-02-07 DIAGNOSIS — I10 Essential (primary) hypertension: Secondary | ICD-10-CM | POA: Diagnosis not present

## 2019-02-07 DIAGNOSIS — H2512 Age-related nuclear cataract, left eye: Secondary | ICD-10-CM | POA: Diagnosis not present

## 2019-02-13 ENCOUNTER — Encounter: Payer: Self-pay | Admitting: *Deleted

## 2019-02-13 ENCOUNTER — Other Ambulatory Visit: Payer: Self-pay

## 2019-02-13 ENCOUNTER — Other Ambulatory Visit: Payer: Self-pay | Admitting: Family Medicine

## 2019-02-14 ENCOUNTER — Encounter
Admission: RE | Admit: 2019-02-14 | Discharge: 2019-02-14 | Disposition: A | Discharge status: Home / Self Care | Payer: Medicare Other | Source: Ambulatory Visit | Attending: Ophthalmology | Admitting: Ophthalmology

## 2019-02-14 DIAGNOSIS — Z01812 Encounter for preprocedural laboratory examination: Secondary | ICD-10-CM | POA: Insufficient documentation

## 2019-02-14 DIAGNOSIS — Z1159 Encounter for screening for other viral diseases: Secondary | ICD-10-CM | POA: Insufficient documentation

## 2019-02-15 LAB — NOVEL CORONAVIRUS, NAA (HOSP ORDER, SEND-OUT TO REF LAB; TAT 18-24 HRS): SARS-CoV-2, NAA: NOT DETECTED

## 2019-02-19 ENCOUNTER — Ambulatory Visit
Admission: RE | Admit: 2019-02-19 | Discharge: 2019-02-19 | Disposition: A | Discharge status: Home / Self Care | Payer: Medicare Other | Attending: Ophthalmology | Admitting: Ophthalmology

## 2019-02-19 ENCOUNTER — Ambulatory Visit: Payer: Medicare Other | Admitting: Anesthesiology

## 2019-02-19 ENCOUNTER — Encounter
Admission: RE | Disposition: A | Discharge status: Home / Self Care | Payer: Self-pay | Source: Home / Self Care | Attending: Ophthalmology

## 2019-02-19 ENCOUNTER — Other Ambulatory Visit: Payer: Self-pay

## 2019-02-19 DIAGNOSIS — H2512 Age-related nuclear cataract, left eye: Secondary | ICD-10-CM | POA: Insufficient documentation

## 2019-02-19 DIAGNOSIS — M109 Gout, unspecified: Secondary | ICD-10-CM | POA: Diagnosis not present

## 2019-02-19 DIAGNOSIS — Z79899 Other long term (current) drug therapy: Secondary | ICD-10-CM | POA: Diagnosis not present

## 2019-02-19 DIAGNOSIS — H25812 Combined forms of age-related cataract, left eye: Secondary | ICD-10-CM | POA: Diagnosis not present

## 2019-02-19 DIAGNOSIS — E7800 Pure hypercholesterolemia, unspecified: Secondary | ICD-10-CM | POA: Diagnosis not present

## 2019-02-19 DIAGNOSIS — I10 Essential (primary) hypertension: Secondary | ICD-10-CM | POA: Diagnosis not present

## 2019-02-19 DIAGNOSIS — Z87891 Personal history of nicotine dependence: Secondary | ICD-10-CM | POA: Insufficient documentation

## 2019-02-19 DIAGNOSIS — Z7982 Long term (current) use of aspirin: Secondary | ICD-10-CM | POA: Insufficient documentation

## 2019-02-19 HISTORY — DX: Presence of dental prosthetic device (complete) (partial): Z97.2

## 2019-02-19 HISTORY — PX: CATARACT EXTRACTION W/PHACO: SHX586

## 2019-02-19 SURGERY — PHACOEMULSIFICATION, CATARACT, WITH IOL INSERTION
Anesthesia: Monitor Anesthesia Care | Site: Eye | Laterality: Left

## 2019-02-19 MED ORDER — LACTATED RINGERS IV SOLN: 10.0000 mL/h | INTRAVENOUS | Status: DC

## 2019-02-19 MED ORDER — BRIMONIDINE TARTRATE-TIMOLOL 0.2-0.5 % OP SOLN
OPHTHALMIC | Status: DC | PRN
  Administered 2019-02-19: 1 [drp] via OPHTHALMIC

## 2019-02-19 MED ORDER — NA HYALUR & NA CHOND-NA HYALUR 0.4-0.35 ML IO KIT
PACK | INTRAOCULAR | Status: DC | PRN
  Administered 2019-02-19: 1 mL via INTRAOCULAR

## 2019-02-19 MED ORDER — CEFUROXIME OPHTHALMIC INJECTION 1 MG/0.1 ML
INJECTION | OPHTHALMIC | Status: DC | PRN
  Administered 2019-02-19: 0.1 mL via INTRACAMERAL

## 2019-02-19 MED ORDER — FENTANYL CITRATE (PF) 100 MCG/2ML IJ SOLN
INTRAMUSCULAR | Status: DC | PRN
  Administered 2019-02-19: 100 ug via INTRAVENOUS

## 2019-02-19 MED ORDER — TETRACAINE HCL 0.5 % OP SOLN
1.0000 [drp] | OPHTHALMIC | Status: DC | PRN
  Administered 2019-02-19 (×3): 1 [drp] via OPHTHALMIC

## 2019-02-19 MED ORDER — EPINEPHRINE PF 1 MG/ML IJ SOLN
INTRAOCULAR | Status: DC | PRN
  Administered 2019-02-19: 11:00:00 71 mL via OPHTHALMIC

## 2019-02-19 MED ORDER — MIDAZOLAM HCL 2 MG/2ML IJ SOLN
INTRAMUSCULAR | Status: DC | PRN
  Administered 2019-02-19: 2 mg via INTRAVENOUS

## 2019-02-19 MED ORDER — ACETAMINOPHEN 160 MG/5ML PO SOLN: 325.0000 mg | Freq: Once | ORAL | Status: DC

## 2019-02-19 MED ORDER — LIDOCAINE HCL (PF) 2 % IJ SOLN
INTRAOCULAR | Status: DC | PRN
  Administered 2019-02-19: 1 mL

## 2019-02-19 MED ORDER — MOXIFLOXACIN HCL 0.5 % OP SOLN
1.0000 [drp] | OPHTHALMIC | Status: DC | PRN
  Administered 2019-02-19 (×3): 1 [drp] via OPHTHALMIC

## 2019-02-19 MED ORDER — ACETAMINOPHEN 325 MG PO TABS: 325.0000 mg | ORAL_TABLET | Freq: Once | ORAL | Status: DC

## 2019-02-19 MED ORDER — ARMC OPHTHALMIC DILATING DROPS
1.0000 "application " | OPHTHALMIC | Status: DC | PRN
  Administered 2019-02-19 (×3): 1 via OPHTHALMIC

## 2019-02-19 SURGICAL SUPPLY — 21 items
CANNULA ANT/CHMB 27G (MISCELLANEOUS) ×1 IMPLANT
CANNULA ANT/CHMB 27GA (MISCELLANEOUS) ×3 IMPLANT
GLOVE SURG LX 7.5 STRW (GLOVE) ×2
GLOVE SURG LX STRL 7.5 STRW (GLOVE) ×1 IMPLANT
GLOVE SURG TRIUMPH 8.0 PF LTX (GLOVE) ×3 IMPLANT
GOWN STRL REUS W/ TWL LRG LVL3 (GOWN DISPOSABLE) ×2 IMPLANT
GOWN STRL REUS W/TWL LRG LVL3 (GOWN DISPOSABLE) ×4
LENS IOL ACRSF IQ ULTRA 18.0 (Intraocular Lens) IMPLANT
LENS IOL ACRYSOF IQ 18.0 (Intraocular Lens) ×3 IMPLANT
MARKER SKIN DUAL TIP RULER LAB (MISCELLANEOUS) ×3 IMPLANT
NDL FILTER BLUNT 18X1 1/2 (NEEDLE) ×1 IMPLANT
NEEDLE FILTER BLUNT 18X 1/2SAF (NEEDLE) ×2
NEEDLE FILTER BLUNT 18X1 1/2 (NEEDLE) ×1 IMPLANT
PACK CATARACT BRASINGTON (MISCELLANEOUS) ×3 IMPLANT
PACK EYE AFTER SURG (MISCELLANEOUS) ×3 IMPLANT
PACK OPTHALMIC (MISCELLANEOUS) ×3 IMPLANT
SYR 3ML LL SCALE MARK (SYRINGE) ×3 IMPLANT
SYR 5ML LL (SYRINGE) ×3 IMPLANT
SYR TB 1ML LUER SLIP (SYRINGE) ×3 IMPLANT
WATER STERILE IRR 500ML POUR (IV SOLUTION) ×3 IMPLANT
WIPE NON LINTING 3.25X3.25 (MISCELLANEOUS) ×3 IMPLANT

## 2019-02-19 NOTE — Discharge Instructions (Signed)

## 2019-02-19 NOTE — H&P (Signed)

## 2019-02-19 NOTE — Anesthesia Procedure Notes (Signed)
Procedure Name: MAC Date/Time: 02/19/2019 11:02 AM Performed by: Janna Arch, CRNA Pre-anesthesia Checklist: Patient identified, Emergency Drugs available, Suction available, Timeout performed and Patient being monitored Patient Re-evaluated:Patient Re-evaluated prior to induction Oxygen Delivery Method: Nasal cannula Placement Confirmation: positive ETCO2

## 2019-02-19 NOTE — Anesthesia Preprocedure Evaluation (Signed)
Anesthesia Evaluation  Patient identified by MRN, date of birth, ID band Patient awake    Reviewed: Allergy & Precautions, H&P , NPO status , Patient's Chart, lab work & pertinent test results  Airway Mallampati: I  TM Distance: >3 FB Neck ROM: full    Dental no notable dental hx.    Pulmonary former smoker,    Pulmonary exam normal breath sounds clear to auscultation       Cardiovascular hypertension, Normal cardiovascular exam Rhythm:regular Rate:Normal     Neuro/Psych    GI/Hepatic   Endo/Other    Renal/GU      Musculoskeletal   Abdominal   Peds  Hematology   Anesthesia Other Findings   Reproductive/Obstetrics                             Anesthesia Physical Anesthesia Plan  ASA: II  Anesthesia Plan: MAC   Post-op Pain Management:    Induction:   PONV Risk Score and Plan: 1 and Midazolam and Treatment may vary due to age or medical condition  Airway Management Planned:   Additional Equipment:   Intra-op Plan:   Post-operative Plan:   Informed Consent: I have reviewed the patients History and Physical, chart, labs and discussed the procedure including the risks, benefits and alternatives for the proposed anesthesia with the patient or authorized representative who has indicated his/her understanding and acceptance.       Plan Discussed with: CRNA  Anesthesia Plan Comments:         Anesthesia Quick Evaluation

## 2019-02-19 NOTE — Op Note (Signed)
OPERATIVE NOTE  David Turner [id number] 02/19/2019   PREOPERATIVE DIAGNOSIS:  Nuclear sclerotic cataract left eye. H25.12   POSTOPERATIVE DIAGNOSIS:    Nuclear sclerotic cataract left eye.     PROCEDURE:  Phacoemusification with posterior chamber intraocular lens placement of the left eye   LENS:   Implant Name Type Inv. Item Serial No. Manufacturer Lot No. LRB No. Used  LENS IOL ACRYSOF IQ 18.0 - [id number] Intraocular Lens LENS IOL ACRYSOF IQ 18.0 [id number] ALCON  Left 1        ULTRASOUND TIME: 12  % of 1 minutes 16 seconds, CDE 8.8  SURGEON:  Wyonia Hough, MD   ANESTHESIA:  Topical with tetracaine drops and 2% Xylocaine jelly, augmented with 1% preservative-free intracameral lidocaine.    COMPLICATIONS:  None.   DESCRIPTION OF PROCEDURE:  The patient was identified in the holding room and transported to the operating room and placed in the supine position under the operating microscope.  The left eye was identified as the operative eye and it was prepped and draped in the usual sterile ophthalmic fashion.   A 1 millimeter clear-corneal paracentesis was made at the 1:30 position.  0.5 ml of preservative-free 1% lidocaine was injected into the anterior chamber.  The anterior chamber was filled with Viscoat viscoelastic.  A 2.4 millimeter keratome was used to make a near-clear corneal incision at the 10:30 position.  .  A curvilinear capsulorrhexis was made with a cystotome and capsulorrhexis forceps.  Balanced salt solution was used to hydrodissect and hydrodelineate the nucleus.   Phacoemulsification was then used in stop and chop fashion to remove the lens nucleus and epinucleus.  The remaining cortex was then removed using the irrigation and aspiration handpiece. Provisc was then placed into the capsular bag to distend it for lens placement.  A lens was then injected into the capsular bag.  The remaining viscoelastic was aspirated.   Wounds were hydrated with  balanced salt solution.  The anterior chamber was inflated to a physiologic pressure with balanced salt solution.  No wound leaks were noted. Cefuroxime 0.1 ml of a 10mg /ml solution was injected into the anterior chamber for a dose of 1 mg of intracameral antibiotic at the completion of the case.   Timolol and Brimonidine drops were applied to the eye.  The patient was taken to the recovery room in stable condition without complications of anesthesia or surgery.  , 02/19/2019, 11:22 AM

## 2019-02-19 NOTE — Anesthesia Postprocedure Evaluation (Signed)
Anesthesia Post Note  Patient: David Turner  Procedure(s) Performed: CATARACT EXTRACTION PHACO AND INTRAOCULAR LENS PLACEMENT ([facility])  LEFT (Left Eye)  Patient location during evaluation: PACU Anesthesia Type: MAC Level of consciousness: awake and alert and oriented Pain management: satisfactory to patient Vital Signs Assessment: post-procedure vital signs reviewed and stable Respiratory status: spontaneous breathing, nonlabored ventilation and respiratory function stable Cardiovascular status: blood pressure returned to baseline and stable Postop Assessment: Adequate PO intake and No signs of nausea or vomiting Anesthetic complications: no    Raliegh Ip

## 2019-02-19 NOTE — Transfer of Care (Signed)
Immediate Anesthesia Transfer of Care Note  Patient: David Turner  Procedure(s) Performed: CATARACT EXTRACTION PHACO AND INTRAOCULAR LENS PLACEMENT ([facility])  LEFT (Left Eye)  Patient Location: PACU  Anesthesia Type: MAC  Level of Consciousness: awake, alert  and patient cooperative  Airway and Oxygen Therapy: Patient Spontanous Breathing and Patient connected to supplemental oxygen  Post-op Assessment: Post-op Vital signs reviewed, Patient's Cardiovascular Status Stable, Respiratory Function Stable, Patent Airway and No signs of Nausea or vomiting  Post-op Vital Signs: Reviewed and stable  Complications: No apparent anesthesia complications

## 2019-02-20 ENCOUNTER — Encounter: Payer: Self-pay | Admitting: Ophthalmology

## 2019-04-14 ENCOUNTER — Other Ambulatory Visit: Payer: Self-pay

## 2019-05-14 ENCOUNTER — Other Ambulatory Visit: Payer: Self-pay | Admitting: Family Medicine

## 2019-06-16 ENCOUNTER — Telehealth: Payer: Self-pay

## 2019-06-16 DIAGNOSIS — I10 Essential (primary) hypertension: Secondary | ICD-10-CM

## 2019-06-16 DIAGNOSIS — Z125 Encounter for screening for malignant neoplasm of prostate: Secondary | ICD-10-CM

## 2019-06-16 DIAGNOSIS — N429 Disorder of prostate, unspecified: Secondary | ICD-10-CM

## 2019-06-16 DIAGNOSIS — E785 Hyperlipidemia, unspecified: Secondary | ICD-10-CM

## 2019-06-16 NOTE — Telephone Encounter (Signed)
Copied from [place] [phone]. Topic: Appointment Scheduling - Scheduling Inquiry for Clinic >> Jun 13, 2019  5:14 PM Rutherford Nail, [place] wrote: Reason for CRM: Patient calling and would like to know if Dr Deborra Medina could put in CPE labs for the patient to get done at his 06/19/2019 visit. States that he lives in [place] and him and his wife are coming to get their injections. Please advise.

## 2019-06-17 NOTE — Telephone Encounter (Signed)
TA-please advise on message below. I do not see a CPE visit scheduled

## 2019-06-17 NOTE — Addendum Note (Signed)
Addended by: Lucille Passy on: 06/17/2019 04:58 PM   Modules accepted: Orders

## 2019-06-17 NOTE — Telephone Encounter (Addendum)
Orders entered. Thank you.

## 2019-06-18 ENCOUNTER — Other Ambulatory Visit: Payer: Self-pay

## 2019-06-18 ENCOUNTER — Telehealth: Payer: Self-pay

## 2019-06-18 NOTE — Telephone Encounter (Signed)
Okay to schedule lab appt for tomorrow.

## 2019-06-18 NOTE — Telephone Encounter (Signed)

## 2019-06-18 NOTE — Telephone Encounter (Signed)
Done

## 2019-06-19 ENCOUNTER — Encounter: Payer: Self-pay | Admitting: Family Medicine

## 2019-06-19 ENCOUNTER — Other Ambulatory Visit (INDEPENDENT_AMBULATORY_CARE_PROVIDER_SITE_OTHER): Payer: Medicare Other

## 2019-06-19 ENCOUNTER — Ambulatory Visit (INDEPENDENT_AMBULATORY_CARE_PROVIDER_SITE_OTHER): Payer: Medicare Other | Admitting: Behavioral Health

## 2019-06-19 DIAGNOSIS — I10 Essential (primary) hypertension: Secondary | ICD-10-CM | POA: Diagnosis not present

## 2019-06-19 DIAGNOSIS — Z23 Encounter for immunization: Secondary | ICD-10-CM | POA: Diagnosis not present

## 2019-06-19 DIAGNOSIS — N429 Disorder of prostate, unspecified: Secondary | ICD-10-CM | POA: Diagnosis not present

## 2019-06-19 DIAGNOSIS — E785 Hyperlipidemia, unspecified: Secondary | ICD-10-CM | POA: Diagnosis not present

## 2019-06-19 DIAGNOSIS — Z125 Encounter for screening for malignant neoplasm of prostate: Secondary | ICD-10-CM

## 2019-06-19 LAB — CBC WITH DIFFERENTIAL/PLATELET
Basophils Absolute: 0 10*3/uL (ref 0.0–0.1)
Basophils Relative: 0.3 % (ref 0.0–3.0)
Eosinophils Absolute: 0.1 10*3/uL (ref 0.0–0.7)
Eosinophils Relative: 2.1 % (ref 0.0–5.0)
HCT: 44.8 % (ref 39.0–52.0)
Hemoglobin: 14.8 g/dL (ref 13.0–17.0)
Lymphocytes Relative: 36 % (ref 12.0–46.0)
Lymphs Abs: 1.9 10*3/uL (ref 0.7–4.0)
MCHC: 33 g/dL (ref 30.0–36.0)
MCV: 89.3 fl (ref 78.0–100.0)
Monocytes Absolute: 0.5 10*3/uL (ref 0.1–1.0)
Monocytes Relative: 8.8 % (ref 3.0–12.0)
Neutro Abs: 2.8 10*3/uL (ref 1.4–7.7)
Neutrophils Relative %: 52.8 % (ref 43.0–77.0)
Platelets: 228 10*3/uL (ref 150.0–400.0)
RBC: 5.02 Mil/uL (ref 4.22–5.81)
RDW: 15.2 % (ref 11.5–15.5)
WBC: 5.3 10*3/uL (ref 4.0–10.5)

## 2019-06-19 LAB — COMPREHENSIVE METABOLIC PANEL
ALT: 29 U/L (ref 0–53)
AST: 23 U/L (ref 0–37)
Albumin: 4.5 g/dL (ref 3.5–5.2)
Alkaline Phosphatase: 84 U/L (ref 39–117)
BUN: 15 mg/dL (ref 6–23)
CO2: 29 mEq/L (ref 19–32)
Calcium: 10.2 mg/dL (ref 8.4–10.5)
Chloride: 100 mEq/L (ref 96–112)
Creatinine, Ser: 0.78 mg/dL (ref 0.40–1.50)
GFR: 98.95 mL/min (ref >60.00)
Glucose, Bld: 111 mg/dL — ABNORMAL HIGH (ref 70–99)
Potassium: 4.7 mEq/L (ref 3.5–5.1)
Sodium: 137 mEq/L (ref 135–145)
Total Bilirubin: 0.6 mg/dL (ref 0.2–1.2)
Total Protein: 7.2 g/dL (ref 6.0–8.3)

## 2019-06-19 LAB — LIPID PANEL
Cholesterol: 217 mg/dL — ABNORMAL HIGH (ref 0–200)
HDL: 65.4 mg/dL (ref >39.00)
LDL Cholesterol: 136 mg/dL — ABNORMAL HIGH (ref 0–99)
NonHDL: 151.86
Total CHOL/HDL Ratio: 3
Triglycerides: 81 mg/dL (ref 0.0–149.0)
VLDL: 16.2 mg/dL (ref 0.0–40.0)

## 2019-06-19 LAB — PSA: PSA: 2.34 ng/mL (ref 0.10–4.00)

## 2019-06-19 NOTE — Progress Notes (Signed)
Patient presents in clinic today for Influenza and Pneumococcal vaccinations. RN received verbal order from Dr. Ethelene Hal to administer Pneumovax 23. IM injections were given in both the right & left deltoids. Patient tolerated the injections well. No signs or symptoms of a reaction were noted prior to patient leaving the nurse visit.

## 2019-07-01 ENCOUNTER — Encounter: Payer: Self-pay | Admitting: Family Medicine

## 2019-07-01 NOTE — Telephone Encounter (Signed)
Pt has called back and he is fine with the cost of the Shingles. Please have Dr T go ahead and order them he says she can order for he and his wife. A FU call to set lab appt would be great!

## 2019-07-03 ENCOUNTER — Other Ambulatory Visit: Payer: Self-pay

## 2019-07-03 MED ORDER — TADALAFIL 20 MG PO TABS: 10.0000 mg | ORAL_TABLET | ORAL | 0 refills | Status: DC | PRN

## 2019-07-03 NOTE — Telephone Encounter (Signed)
Last fill 11/26/17  #5/11 Last oV 01/22/18 Pt need a follow up visit before any other refills.

## 2019-07-03 NOTE — Telephone Encounter (Signed)
Hey Dr Deborra Medina from what I have been told we need conformation from you in order to schedule the appt for the injection, Is this ok to schedule?

## 2019-07-08 ENCOUNTER — Ambulatory Visit (INDEPENDENT_AMBULATORY_CARE_PROVIDER_SITE_OTHER): Payer: Medicare Other

## 2019-07-08 ENCOUNTER — Other Ambulatory Visit: Payer: Self-pay

## 2019-07-08 DIAGNOSIS — Z23 Encounter for immunization: Secondary | ICD-10-CM

## 2019-07-08 NOTE — Progress Notes (Signed)
**Note e-Identified via Obfuscation** After obtaining consent, and per orders of r. eborra Medina, injection of Shingrix given in left arm by  Berneta Sages. Patient instructed to remain in clinic for 20 minutes afterwards, and to report any adverse reaction to me immediately. Next nurse visit is scheduled/thx dmf

## 2019-07-09 NOTE — Progress Notes (Signed)
I reviewed CMA's note, was available for consultation, and agree with documentation and plan.  

## 2019-08-12 ENCOUNTER — Other Ambulatory Visit: Payer: Self-pay

## 2019-08-12 MED ORDER — LISINOPRIL-HYDROCHLOROTHIAZIDE 20-12.5 MG PO TABS: ORAL_TABLET | ORAL | 0 refills | Status: DC

## 2019-08-12 MED ORDER — ALLOPURINOL 300 MG PO TABS: ORAL_TABLET | ORAL | 0 refills | Status: DC

## 2019-09-08 ENCOUNTER — Encounter: Payer: Self-pay | Admitting: Family Medicine

## 2019-09-08 ENCOUNTER — Telehealth: Payer: Self-pay

## 2019-09-08 NOTE — Telephone Encounter (Signed)

## 2019-09-09 ENCOUNTER — Ambulatory Visit (INDEPENDENT_AMBULATORY_CARE_PROVIDER_SITE_OTHER): Payer: Medicare Other

## 2019-09-09 ENCOUNTER — Other Ambulatory Visit: Payer: Self-pay

## 2019-09-09 DIAGNOSIS — Z23 Encounter for immunization: Secondary | ICD-10-CM | POA: Diagnosis not present

## 2019-09-09 NOTE — Progress Notes (Signed)
Pt came into the office to get 2nd shingrix shot, gave injection into the left arm, pt tolerated injection well and gave informations sheet to the pt.

## 2019-09-25 ENCOUNTER — Other Ambulatory Visit: Payer: Self-pay | Admitting: Family Medicine

## 2019-09-25 NOTE — Telephone Encounter (Signed)
Must schedule phone visit for fills/thx dmf
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
## 2019-09-30 ENCOUNTER — Other Ambulatory Visit: Payer: Self-pay

## 2019-09-30 ENCOUNTER — Ambulatory Visit (INDEPENDENT_AMBULATORY_CARE_PROVIDER_SITE_OTHER): Payer: Medicare Other

## 2019-09-30 ENCOUNTER — Encounter: Payer: Self-pay | Admitting: Family Medicine

## 2019-09-30 ENCOUNTER — Telehealth (INDEPENDENT_AMBULATORY_CARE_PROVIDER_SITE_OTHER): Payer: Medicare Other | Admitting: Family Medicine

## 2019-09-30 ENCOUNTER — Other Ambulatory Visit: Payer: Medicare Other

## 2019-09-30 VITALS — BP 144/68 | HR 82

## 2019-09-30 DIAGNOSIS — I10 Essential (primary) hypertension: Secondary | ICD-10-CM | POA: Diagnosis not present

## 2019-09-30 DIAGNOSIS — M10479 Other secondary gout, unspecified ankle and foot: Secondary | ICD-10-CM

## 2019-09-30 DIAGNOSIS — M1612 Unilateral primary osteoarthritis, left hip: Secondary | ICD-10-CM | POA: Diagnosis not present

## 2019-09-30 DIAGNOSIS — M25552 Pain in left hip: Secondary | ICD-10-CM | POA: Diagnosis not present

## 2019-09-30 DIAGNOSIS — N529 Male erectile dysfunction, unspecified: Secondary | ICD-10-CM | POA: Diagnosis not present

## 2019-09-30 MED ORDER — TADALAFIL 20 MG PO TABS: 10.0000 mg | ORAL_TABLET | ORAL | 11 refills | Status: DC | PRN

## 2019-09-30 MED ORDER — LISINOPRIL-HYDROCHLOROTHIAZIDE 20-12.5 MG PO TABS: ORAL_TABLET | ORAL | 3 refills | Status: DC

## 2019-09-30 MED ORDER — ALLOPURINOL 300 MG PO TABS: ORAL_TABLET | ORAL | 3 refills | Status: DC

## 2019-09-30 NOTE — Addendum Note (Signed)
Addended by: Lynnea Ferrier on: 09/30/2019 01:37 PM   Modules accepted: Orders

## 2019-09-30 NOTE — Addendum Note (Signed)
Addended by: Lynnea Ferrier on: 09/30/2019 01:55 PM   Modules accepted: Orders
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
## 2019-09-30 NOTE — Assessment & Plan Note (Signed)
History: Review: taking medications as instructed, no medication side effects noted, no TIAs, no chest pain on exertion, no dyspnea on exertion, no swelling of ankles. Smoker: No.  BP Readings from Last 3 Encounters:  02/19/19 109/76  04/11/18 138/86  04/11/18 138/86   Lab Results  Component Value Date   CREATININE 0.78 06/19/2019     Assessment/Plan: 1. Medication: no change.  Continue current dose of lisinopril hctz.  Due for labs today. 2. Dietary sodium restriction. 3. Regular aerobic exercise.

## 2019-09-30 NOTE — Assessment & Plan Note (Signed)
He also takes Tadalafil 20mg  for ED. No side effects, asking for refills.

## 2019-09-30 NOTE — Assessment & Plan Note (Addendum)
He currently takes Allopurinol for Gout suppression therapy.  Has not had a gout flare in years. Lab Results  Component Value Date   CREATININE 0.78 06/19/2019   No results found for: [facility]

## 2019-10-01 ENCOUNTER — Encounter: Payer: Self-pay | Admitting: Family Medicine

## 2019-10-01 ENCOUNTER — Other Ambulatory Visit: Payer: Self-pay | Admitting: Family Medicine

## 2019-10-01 DIAGNOSIS — M1612 Unilateral primary osteoarthritis, left hip: Secondary | ICD-10-CM

## 2019-10-02 ENCOUNTER — Telehealth: Payer: Self-pay

## 2019-10-02 DIAGNOSIS — H40003 Preglaucoma, unspecified, bilateral: Secondary | ICD-10-CM | POA: Diagnosis not present

## 2019-10-02 NOTE — Telephone Encounter (Signed)
PA for Cialis has been initiated and faxed to plan/thx dmf

## 2019-10-07 NOTE — Telephone Encounter (Signed)
PA for Cialis denied as it is an excluded benefit/faxed to pharmacy/thx dmf

## 2019-10-10 ENCOUNTER — Encounter: Payer: Self-pay | Admitting: Family Medicine

## 2019-10-22 DIAGNOSIS — H40003 Preglaucoma, unspecified, bilateral: Secondary | ICD-10-CM | POA: Diagnosis not present

## 2019-10-26 ENCOUNTER — Ambulatory Visit: Payer: Medicare Other | Attending: Internal Medicine

## 2019-10-26 DIAGNOSIS — Z23 Encounter for immunization: Secondary | ICD-10-CM

## 2019-10-26 NOTE — Progress Notes (Signed)
   Covid-19 Vaccination Clinic  Name:  David Turner    MRN: [id number] DOB: 1951-07-28  10/26/2019  Mr. David Turner was observed post Covid-19 immunization for 15 minutes without incidence. He was provided with Vaccine Information Sheet and instruction to access the V-Safe system.   Mr. David Turner was instructed to call 911 with any severe reactions post vaccine: Marland Kitchen Difficulty breathing  . Swelling of your face and throat  . A fast heartbeat  . A bad rash all over your body  . Dizziness and weakness    Immunizations Administered    Name Date Dose VIS Date Route   Pfizer COVID-19 Vaccine 10/26/2019 11:24 AM 0.3 mL 08/22/2019 Intramuscular   Manufacturer: [place]   Lot: [id number]   [place]: [id number]

## 2019-11-03 ENCOUNTER — Ambulatory Visit (INDEPENDENT_AMBULATORY_CARE_PROVIDER_SITE_OTHER): Payer: Medicare Other | Admitting: Primary Care

## 2019-11-03 ENCOUNTER — Other Ambulatory Visit: Payer: Self-pay

## 2019-11-03 ENCOUNTER — Encounter: Payer: Self-pay | Admitting: Primary Care

## 2019-11-03 VITALS — BP 144/90 | HR 68 | Temp 96.9°F | Ht 69.0 in | Wt 191.0 lb

## 2019-11-03 DIAGNOSIS — M25552 Pain in left hip: Secondary | ICD-10-CM | POA: Diagnosis not present

## 2019-11-03 DIAGNOSIS — M10479 Other secondary gout, unspecified ankle and foot: Secondary | ICD-10-CM | POA: Diagnosis not present

## 2019-11-03 DIAGNOSIS — I10 Essential (primary) hypertension: Secondary | ICD-10-CM

## 2019-11-03 DIAGNOSIS — N529 Male erectile dysfunction, unspecified: Secondary | ICD-10-CM

## 2019-11-03 DIAGNOSIS — E785 Hyperlipidemia, unspecified: Secondary | ICD-10-CM | POA: Diagnosis not present

## 2019-11-03 MED ORDER — ATORVASTATIN CALCIUM 20 MG PO TABS: 20.0000 mg | ORAL_TABLET | Freq: Every evening | ORAL | 3 refills | Status: DC

## 2019-11-03 NOTE — Assessment & Plan Note (Signed)
Uncontrolled with ASCVD risk score of 20%. Given his hypertension, family history of heart disease, and LDL of 136 we will treat.  He did well on atorvastatin, will resume at 20 mg. Repeat lipids in 2 months.

## 2019-11-03 NOTE — Assessment & Plan Note (Signed)
Above goal in the office today, also on prior visit in January 2021. Recommended we adjust his medication, he kindly declines.   He will start monitoring BP at home and report readings to [place] in a few weeks. Discussed that BP goal is <135/90.  Discussed long term effects of uncontrolled hypertension.
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
## 2019-11-03 NOTE — Assessment & Plan Note (Signed)
Doing well on allopurinol 300 mg, no recent flares. Continue current regimen.  Renal function reviewed from October 2020.

## 2019-11-03 NOTE — Assessment & Plan Note (Signed)
No recent use of Cialis.  Continue to monitor.

## 2019-11-03 NOTE — Assessment & Plan Note (Signed)
Chronic for years, worse over the last two months. Plain films with evidence of osteoarthritis. He would like to start by seeing Dr. Lorelei Pont, discussed that he may eventually need to see orthopedics.  He will schedule a visit with [place].

## 2019-11-03 NOTE — Patient Instructions (Addendum)
Start atorvastatin 20 mg once daily for cholesterol. Take this in the evening.   Start monitoring your blood pressure daily, around the same time of day, for the next 2-3 weeks.  Ensure that you have rested for 30 minutes prior to checking your blood pressure. Record your readings and send readings through My Chart. Your blood pressure should be less than 135/90.  Schedule a visit with Dr. Lorelei Pont for your hip.  Schedule a lab appointment for two months to repeat your cholesterol.  It was a pleasure meeting you!   DASH Eating Plan DASH stands for "Dietary Approaches to Stop Hypertension." The DASH eating plan is a healthy eating plan that has been shown to reduce high blood pressure (hypertension). It may also reduce your risk for type 2 diabetes, heart disease, and stroke. The DASH eating plan may also help with weight loss. What are tips for following this plan?  General guidelines  Avoid eating more than 2,300 mg (milligrams) of salt (sodium) a day. If you have hypertension, you may need to reduce your sodium intake to 1,500 mg a day.  Limit alcohol intake to no more than 1 drink a day for nonpregnant women and 2 drinks a day for men. One drink equals 12 oz of beer, 5 oz of wine, or 1 oz of hard liquor.  Work with your health care provider to maintain a healthy body weight or to lose weight. Ask what an ideal weight is for you.  Get at least 30 minutes of exercise that causes your heart to beat faster (aerobic exercise) most days of the week. Activities may include walking, swimming, or biking.  Work with your health care provider or diet and nutrition specialist (dietitian) to adjust your eating plan to your individual calorie needs. Reading food labels   Check food labels for the amount of sodium per serving. Choose foods with less than 5 percent of the Daily Value of sodium. Generally, foods with less than 300 mg of sodium per serving fit into this eating plan.  To find whole  grains, look for the word "whole" as the first word in the ingredient list. Shopping  Buy products labeled as "low-sodium" or "no salt added."  Buy fresh foods. Avoid canned foods and premade or frozen meals. Cooking  Avoid adding salt when cooking. Use salt-free seasonings or herbs instead of table salt or sea salt. Check with your health care provider or pharmacist before using salt substitutes.  Do not fry foods. Cook foods using healthy methods such as baking, boiling, grilling, and broiling instead.  Cook with heart-healthy oils, such as olive, canola, soybean, or sunflower oil. Meal planning  Eat a balanced diet that includes: ? 5 or more servings of fruits and vegetables each day. At each meal, try to fill half of your plate with fruits and vegetables. ? Up to 6-8 servings of whole grains each day. ? Less than 6 oz of lean meat, poultry, or fish each day. A 3-oz serving of meat is about the same size as a deck of cards. One egg equals 1 oz. ? 2 servings of low-fat dairy each day. ? A serving of nuts, seeds, or beans 5 times each week. ? Heart-healthy fats. Healthy fats called Omega-3 fatty acids are found in foods such as flaxseeds and coldwater fish, like sardines, salmon, and mackerel.  Limit how much you eat of the following: ? Canned or prepackaged foods. ? Food that is high in trans fat, such as fried foods. ?  Food that is high in saturated fat, such as fatty meat. ? Sweets, desserts, sugary drinks, and other foods with added sugar. ? Full-fat dairy products.  Do not salt foods before eating.  Try to eat at least 2 vegetarian meals each week.  Eat more home-cooked food and less restaurant, buffet, and fast food.  When eating at a restaurant, ask that your food be prepared with less salt or no salt, if possible. What foods are recommended? The items listed may not be a complete list. Talk with your dietitian about what dietary choices are best for  you. Grains Whole-grain or whole-wheat bread. Whole-grain or whole-wheat pasta. Brown rice. [place]. Bulgur. Whole-grain and low-sodium cereals. Pita bread. Low-fat, low-sodium crackers. Whole-wheat flour tortillas. Vegetables Fresh or frozen vegetables (raw, steamed, roasted, or grilled). Low-sodium or reduced-sodium tomato and vegetable juice. Low-sodium or reduced-sodium tomato sauce and tomato paste. Low-sodium or reduced-sodium canned vegetables. Fruits All fresh, dried, or frozen fruit. Canned fruit in natural juice (without added sugar). Meat and other protein foods Skinless chicken or [place]. Ground chicken or [place]. Pork with fat trimmed off. Fish and seafood. Egg whites. Dried beans, peas, or lentils. Unsalted nuts, nut butters, and seeds. Unsalted canned beans. Lean cuts of beef with fat trimmed off. Low-sodium, lean deli meat. Dairy Low-fat (1%) or fat-free (skim) milk. Fat-free, low-fat, or reduced-fat cheeses. Nonfat, low-sodium ricotta or cottage cheese. Low-fat or nonfat yogurt. Low-fat, low-sodium cheese. Fats and oils Soft margarine without trans fats. Vegetable oil. Low-fat, reduced-fat, or light mayonnaise and salad dressings (reduced-sodium). Canola, safflower, olive, soybean, and sunflower oils. Avocado. Seasoning and other foods Herbs. Spices. Seasoning mixes without salt. Unsalted popcorn and pretzels. Fat-free sweets. What foods are not recommended? The items listed may not be a complete list. Talk with your dietitian about what dietary choices are best for you. Grains Baked goods made with fat, such as croissants, muffins, or some breads. Dry pasta or rice meal packs. Vegetables Creamed or fried vegetables. Vegetables in a cheese sauce. Regular canned vegetables (not low-sodium or reduced-sodium). Regular canned tomato sauce and paste (not low-sodium or reduced-sodium). Regular tomato and vegetable juice (not low-sodium or reduced-sodium). Angie Fava.  Olives. Fruits Canned fruit in a light or heavy syrup. Fried fruit. Fruit in cream or butter sauce. Meat and other protein foods Fatty cuts of meat. Ribs. Fried meat. Berniece Salines. Sausage. Bologna and other processed lunch meats. Salami. Fatback. Hotdogs. Bratwurst. Salted nuts and seeds. Canned beans with added salt. Canned or smoked fish. Whole eggs or egg yolks. Chicken or [place] with skin. Dairy Whole or 2% milk, cream, and half-and-half. Whole or full-fat cream cheese. Whole-fat or sweetened yogurt. Full-fat cheese. Nondairy creamers. Whipped toppings. Processed cheese and cheese spreads. Fats and oils Butter. Stick margarine. Lard. Shortening. Ghee. Bacon fat. Tropical oils, such as coconut, palm kernel, or palm oil. Seasoning and other foods Salted popcorn and pretzels. Onion salt, garlic salt, seasoned salt, table salt, and sea salt. Worcestershire sauce. Tartar sauce. Barbecue sauce. Teriyaki sauce. Soy sauce, including reduced-sodium. Steak sauce. Canned and packaged gravies. Fish sauce. Oyster sauce. Cocktail sauce. Horseradish that you find on the shelf. Ketchup. Mustard. Meat flavorings and tenderizers. Bouillon cubes. Hot sauce and Tabasco sauce. Premade or packaged marinades. Premade or packaged taco seasonings. Relishes. Regular salad dressings. Where to find more information:  National Heart, Lung, and [place]: https://wilson-eaton.com/  American Heart Association: www.heart.org Summary  The DASH eating plan is a healthy eating plan that has been shown to reduce high blood  pressure (hypertension). It may also reduce your risk for type 2 diabetes, heart disease, and stroke.  With the DASH eating plan, you should limit salt (sodium) intake to 2,300 mg a day. If you have hypertension, you may need to reduce your sodium intake to 1,500 mg a day.  When on the DASH eating plan, aim to eat more fresh fruits and vegetables, whole grains, lean proteins, low-fat dairy, and heart-healthy  fats.  Work with your health care provider or diet and nutrition specialist (dietitian) to adjust your eating plan to your individual calorie needs. This information is not intended to replace advice given to you by your health care provider. Make sure you discuss any questions you have with your health care provider. Document Revised: 08/10/2017 Document Reviewed: 08/21/2016 Elsevier Patient Education  2020 [employer].

## 2019-11-04 NOTE — Progress Notes (Signed)
T. , MD Primary Care and Sports Medicine [facility] at [facility] [place], [address] Phone: [phone]  FAX: [phone]  David Turner - 69 y.o. male  MRN [id number]  Date of Birth: 26-Jun-1951  Visit Date: 11/05/2019  PCP: Pleas Koch, NP  Referred by: Pleas Koch, NP  Chief Complaint  Patient presents with  . Hip Pain    Left    This visit occurred during the SARS-CoV-2 public health emergency.  Safety protocols were in place, including screening questions prior to the visit, additional usage of staff PPE, and extensive cleaning of exam room while observing appropriate contact time as indicated for disinfecting solutions.   Subjective:   David Turner is a 69 y.o. very pleasant male patient with Body mass index is 27.87 kg/m. who presents with the following:  He is a patient with a known advanced osteoarthritis of the left hip  I independently reviewed the patient's x-raysOn September 30, 2019.  He does have advanced osteoarthritis with joint space narrowing and osteophyte formation.  There is no flattening of the femoral head.Electronically Signed  By: Owens Loffler, MD On: 11/05/2019  2:00 PM EST   Dislocated hip in 1973, relocated then.  Over the years if joggig or walking it has numbed up on him a lot more.  Any incline will hurt. Plain films.    His hip is really gotten worse over the last 6 months, and the last year or 2 is gotten worse as well.  His pain is predominantly in the groin region and to a lesser extent in the posterior aspect.  Hip is hurting him a lot worse.  Pretty active normally.  WOrks outside all the time.  Did some motrin.  Other than this, not exercises. Some days it is ok.  Cold makes it worse.   Review of Systems is noted in the HPI, as appropriate   Objective:   BP 140/76   Pulse 76   Temp 98.6 F (37 C) (Temporal)   Ht _0  (1.753 m)   Wt 188 lb 12 oz (85.6 kg)    SpO2 96%   BMI 27.87 kg/m    GEN: No acute distress; alert,appropriate. PULM: Breathing comfortably in no respiratory distress PSYCH: Normally interactive.   HIP EXAM: SIDE: Left ROM: Abduction, Flexion, Internal and External range of motion: Abduction to 30 degrees.  With the hip flexed to 90, there is approximately 20 degrees of motion. Pain with terminal IROM and EROM: Pain with terminal internal and external range of motion. GTB: NT SLR: NEG Knees: No effusion FABER: NT REVERSE FABER: NT, neg Piriformis: NT at direct palpation Str: flexion: 5/5 abduction: 5/5 adduction: 5/5 Strength testing non-tender   Radiology: No results found.  Assessment and Plan:     ICD-10-CM   1. Left hip severe arthritis  M16.12    Moderate to severe left-sided hip osteoarthritis.  Clinically and radiographically.  Change NSAIDs to Celebrex.  Also gave him a number of supplements for him to try.  If symptoms persist, then additional interventional procedures could be considered.  Patient Instructions  OSTEOARTHRITIS:  For symptomatic relief:  Tylenol: 2 tablets up to 3-4 times a day Regular NSAIDS are helpful (avoid in kidney disease and ulcers)  Supplements: Tart cherry juice and Curcumin (Turmeric extract) have good scientific evidence For flares, corticosteroid injections help.  Glucosamine and Chondroitin often helpful - will take about 3 months to see if you  have an effect. If you do, great, keep them up, if none at that point, no need to take in the future.   Ice joints on bad days, 20 min, 2-3 x / day REGULAR EXERCISE: swimming, Yoga, Tai Chi, bicycle (NON-IMPACT activity)   Weight loss will always take stress off of the joints and back     Follow-up: No follow-ups on file.  Meds ordered this encounter  Medications  . celecoxib (CELEBREX) 200 MG capsule    Sig: Take 1 capsule (200 mg total) by mouth daily.    Dispense:  30 capsule    Refill:  2   There are no  discontinued medications. No orders of the defined types were placed in this encounter.   Signed,  Maud Deed. , MD   Outpatient Encounter Medications as of 11/05/2019  Medication Sig  . allopurinol (ZYLOPRIM) 300 MG tablet Take 1qd  . aspirin 81 MG tablet Take 81 mg by mouth daily.  Marland Kitchen atorvastatin (LIPITOR) 20 MG tablet Take 1 tablet (20 mg total) by mouth every evening. For cholesterol.  . Garlic 9678 MG CAPS Take 2 capsules by mouth.  . latanoprost (XALATAN) 0.005 % ophthalmic solution INT 1 GTT IN OU QD HS  . lisinopril-hydrochlorothiazide (ZESTORETIC) 20-12.5 MG tablet Take 1qd  . Multiple Vitamin (MULTIVITAMIN) tablet Take 1 tablet by mouth daily.  . Omega-3 Fatty Acids (FISH OIL) 1000 MG CAPS Take by mouth.  . tadalafil (CIALIS) 20 MG tablet Take 0.5-1 tablets (10-20 mg total) by mouth every other day as needed for erectile dysfunction.  . celecoxib (CELEBREX) 200 MG capsule Take 1 capsule (200 mg total) by mouth daily.   No facility-administered encounter medications on file as of 11/05/2019.

## 2019-11-05 ENCOUNTER — Encounter: Payer: Self-pay | Admitting: Family Medicine

## 2019-11-05 ENCOUNTER — Ambulatory Visit (INDEPENDENT_AMBULATORY_CARE_PROVIDER_SITE_OTHER): Payer: Medicare Other | Admitting: Family Medicine

## 2019-11-05 ENCOUNTER — Other Ambulatory Visit: Payer: Self-pay

## 2019-11-05 VITALS — BP 140/76 | HR 76 | Temp 98.6°F | Ht 69.0 in | Wt 188.8 lb

## 2019-11-05 DIAGNOSIS — M1612 Unilateral primary osteoarthritis, left hip: Secondary | ICD-10-CM

## 2019-11-05 MED ORDER — CELECOXIB 200 MG PO CAPS: 200.0000 mg | ORAL_CAPSULE | Freq: Every day | ORAL | 2 refills | Status: DC

## 2019-11-05 NOTE — Patient Instructions (Signed)
OSTEOARTHRITIS:  For symptomatic relief:  Tylenol: 2 tablets up to 3-4 times a day Regular NSAIDS are helpful (avoid in kidney disease and ulcers)  Supplements: Tart cherry juice and Curcumin (Turmeric extract) have good scientific evidence For flares, corticosteroid injections help.  Glucosamine and Chondroitin often helpful - will take about 3 months to see if you have an effect. If you do, great, keep them up, if none at that point, no need to take in the future.   Ice joints on bad days, 20 min, 2-3 x / day REGULAR EXERCISE: swimming, Yoga, Tai Chi, bicycle (NON-IMPACT activity)   Weight loss will always take stress off of the joints and back  

## 2019-11-20 DIAGNOSIS — I10 Essential (primary) hypertension: Secondary | ICD-10-CM

## 2019-11-21 MED ORDER — HYDROCHLOROTHIAZIDE 12.5 MG PO TABS: 12.5000 mg | ORAL_TABLET | Freq: Every day | ORAL | 0 refills | Status: DC

## 2019-11-25 ENCOUNTER — Ambulatory Visit: Payer: Medicare Other | Attending: Internal Medicine

## 2019-11-25 DIAGNOSIS — Z23 Encounter for immunization: Secondary | ICD-10-CM

## 2019-11-25 NOTE — Progress Notes (Signed)
   Covid-19 Vaccination Clinic  Name:  David Turner    MRN: [id number] DOB: 1950-09-23  11/25/2019  [name] was observed post Covid-19 immunization for 15 minutes without incident. He was provided with Vaccine Information Sheet and instruction to access the V-Safe system.   [name] was instructed to call 911 with any severe reactions post vaccine: Marland Kitchen Difficulty breathing  . Swelling of face and throat  . A fast heartbeat  . A bad rash all over body  . Dizziness and weakness   Immunizations Administered    Name Date Dose VIS Date Route   Pfizer COVID-19 Vaccine 11/25/2019 11:25 AM 0.3 mL 08/22/2019 Intramuscular   Manufacturer: [place]   Lot: [id number]   [place]: [id number]

## 2019-12-08 ENCOUNTER — Encounter: Payer: Self-pay | Admitting: Family Medicine

## 2019-12-25 ENCOUNTER — Other Ambulatory Visit: Payer: Self-pay | Admitting: Primary Care

## 2019-12-25 DIAGNOSIS — E785 Hyperlipidemia, unspecified: Secondary | ICD-10-CM

## 2020-01-06 ENCOUNTER — Other Ambulatory Visit (INDEPENDENT_AMBULATORY_CARE_PROVIDER_SITE_OTHER): Payer: Medicare Other

## 2020-01-06 ENCOUNTER — Other Ambulatory Visit: Payer: Self-pay

## 2020-01-06 ENCOUNTER — Telehealth: Payer: Self-pay | Admitting: *Deleted

## 2020-01-06 DIAGNOSIS — M1612 Unilateral primary osteoarthritis, left hip: Secondary | ICD-10-CM

## 2020-01-06 DIAGNOSIS — E785 Hyperlipidemia, unspecified: Secondary | ICD-10-CM

## 2020-01-06 LAB — LIPID PANEL
Cholesterol: 122 mg/dL (ref 0–200)
HDL: 51.4 mg/dL (ref >39.00)
LDL Cholesterol: 55 mg/dL (ref 0–99)
NonHDL: 70.59
Total CHOL/HDL Ratio: 2
Triglycerides: 77 mg/dL (ref 0.0–149.0)
VLDL: 15.4 mg/dL (ref 0.0–40.0)

## 2020-01-06 LAB — HEPATIC FUNCTION PANEL
ALT: 27 U/L (ref 0–53)
AST: 24 U/L (ref 0–37)
Albumin: 4.3 g/dL (ref 3.5–5.2)
Alkaline Phosphatase: 90 U/L (ref 39–117)
Bilirubin, Direct: 0.1 mg/dL (ref 0.0–0.3)
Total Bilirubin: 0.7 mg/dL (ref 0.2–1.2)
Total Protein: 7.2 g/dL (ref 6.0–8.3)

## 2020-01-06 NOTE — Telephone Encounter (Signed)
Please notify patient that we will place the referral. Someone will be in touch within 1-2 weeks.

## 2020-01-06 NOTE — Telephone Encounter (Signed)
David Turner was here for his lab appointment today and is requesting a referral to an orthopedist for his left hip pain.

## 2020-01-06 NOTE — Telephone Encounter (Signed)
Spoken and notified patient of Kate Clark's comments. Patient verbalized understanding.  

## 2020-01-09 DIAGNOSIS — M1612 Unilateral primary osteoarthritis, left hip: Secondary | ICD-10-CM | POA: Diagnosis not present

## 2020-01-09 DIAGNOSIS — M1712 Unilateral primary osteoarthritis, left knee: Secondary | ICD-10-CM | POA: Diagnosis not present

## 2020-01-19 DIAGNOSIS — M1612 Unilateral primary osteoarthritis, left hip: Secondary | ICD-10-CM | POA: Diagnosis not present

## 2020-01-28 DIAGNOSIS — M1612 Unilateral primary osteoarthritis, left hip: Secondary | ICD-10-CM | POA: Diagnosis not present

## 2020-01-30 DIAGNOSIS — M1612 Unilateral primary osteoarthritis, left hip: Secondary | ICD-10-CM | POA: Diagnosis not present

## 2020-01-31 ENCOUNTER — Other Ambulatory Visit: Payer: Self-pay | Admitting: Primary Care

## 2020-01-31 DIAGNOSIS — I10 Essential (primary) hypertension: Secondary | ICD-10-CM

## 2020-02-02 MED ORDER — LISINOPRIL-HYDROCHLOROTHIAZIDE 20-12.5 MG PO TABS: ORAL_TABLET | ORAL | 1 refills | Status: DC

## 2020-02-03 DIAGNOSIS — M1612 Unilateral primary osteoarthritis, left hip: Secondary | ICD-10-CM | POA: Diagnosis not present

## 2020-02-03 DIAGNOSIS — M1712 Unilateral primary osteoarthritis, left knee: Secondary | ICD-10-CM | POA: Diagnosis not present

## 2020-02-05 DIAGNOSIS — M1612 Unilateral primary osteoarthritis, left hip: Secondary | ICD-10-CM | POA: Diagnosis not present

## 2020-02-10 DIAGNOSIS — M1612 Unilateral primary osteoarthritis, left hip: Secondary | ICD-10-CM | POA: Diagnosis not present

## 2020-02-18 DIAGNOSIS — M1612 Unilateral primary osteoarthritis, left hip: Secondary | ICD-10-CM | POA: Diagnosis not present

## 2020-02-23 DIAGNOSIS — M1612 Unilateral primary osteoarthritis, left hip: Secondary | ICD-10-CM | POA: Diagnosis not present

## 2020-03-16 DIAGNOSIS — M1612 Unilateral primary osteoarthritis, left hip: Secondary | ICD-10-CM | POA: Diagnosis not present

## 2020-03-18 DIAGNOSIS — M1612 Unilateral primary osteoarthritis, left hip: Secondary | ICD-10-CM | POA: Diagnosis not present

## 2020-03-22 DIAGNOSIS — M1612 Unilateral primary osteoarthritis, left hip: Secondary | ICD-10-CM | POA: Diagnosis not present

## 2020-03-30 DIAGNOSIS — M1612 Unilateral primary osteoarthritis, left hip: Secondary | ICD-10-CM | POA: Diagnosis not present

## 2020-04-06 DIAGNOSIS — M1612 Unilateral primary osteoarthritis, left hip: Secondary | ICD-10-CM | POA: Diagnosis not present

## 2020-04-19 DIAGNOSIS — H40003 Preglaucoma, unspecified, bilateral: Secondary | ICD-10-CM | POA: Diagnosis not present

## 2020-04-28 DIAGNOSIS — M1612 Unilateral primary osteoarthritis, left hip: Secondary | ICD-10-CM | POA: Diagnosis not present

## 2020-05-03 DIAGNOSIS — M109 Gout, unspecified: Secondary | ICD-10-CM

## 2020-05-04 DIAGNOSIS — M1612 Unilateral primary osteoarthritis, left hip: Secondary | ICD-10-CM | POA: Diagnosis not present

## 2020-05-04 MED ORDER — ALLOPURINOL 300 MG PO TABS: 300.0000 mg | ORAL_TABLET | Freq: Every day | ORAL | 1 refills | Status: DC

## 2020-05-04 NOTE — Telephone Encounter (Signed)
Have not prescribed  Last OV (transfer of care) with Allie Bossier on 11/03/2019  No future OV scheduled

## 2020-05-08 ENCOUNTER — Other Ambulatory Visit: Payer: Self-pay | Admitting: Primary Care

## 2020-05-14 DIAGNOSIS — Z01812 Encounter for preprocedural laboratory examination: Secondary | ICD-10-CM | POA: Diagnosis not present

## 2020-05-14 DIAGNOSIS — E7800 Pure hypercholesterolemia, unspecified: Secondary | ICD-10-CM | POA: Diagnosis not present

## 2020-05-14 DIAGNOSIS — I10 Essential (primary) hypertension: Secondary | ICD-10-CM | POA: Diagnosis not present

## 2020-05-14 DIAGNOSIS — M1612 Unilateral primary osteoarthritis, left hip: Secondary | ICD-10-CM | POA: Diagnosis not present

## 2020-05-19 DIAGNOSIS — M1612 Unilateral primary osteoarthritis, left hip: Secondary | ICD-10-CM | POA: Diagnosis not present

## 2020-05-19 DIAGNOSIS — I10 Essential (primary) hypertension: Secondary | ICD-10-CM | POA: Diagnosis not present

## 2020-05-19 DIAGNOSIS — M109 Gout, unspecified: Secondary | ICD-10-CM | POA: Diagnosis not present

## 2020-05-19 DIAGNOSIS — E785 Hyperlipidemia, unspecified: Secondary | ICD-10-CM | POA: Diagnosis not present

## 2020-05-19 DIAGNOSIS — Z7289 Other problems related to lifestyle: Secondary | ICD-10-CM | POA: Diagnosis not present

## 2020-05-19 HISTORY — PX: TOTAL HIP ARTHROPLASTY: SHX124

## 2020-05-20 DIAGNOSIS — Z7289 Other problems related to lifestyle: Secondary | ICD-10-CM | POA: Diagnosis not present

## 2020-05-20 DIAGNOSIS — I10 Essential (primary) hypertension: Secondary | ICD-10-CM | POA: Diagnosis not present

## 2020-05-20 DIAGNOSIS — M1612 Unilateral primary osteoarthritis, left hip: Secondary | ICD-10-CM | POA: Diagnosis not present

## 2020-05-20 DIAGNOSIS — E785 Hyperlipidemia, unspecified: Secondary | ICD-10-CM | POA: Diagnosis not present

## 2020-05-20 DIAGNOSIS — M109 Gout, unspecified: Secondary | ICD-10-CM | POA: Diagnosis not present

## 2020-05-26 DIAGNOSIS — M25652 Stiffness of left hip, not elsewhere classified: Secondary | ICD-10-CM | POA: Diagnosis not present

## 2020-05-26 DIAGNOSIS — M25552 Pain in left hip: Secondary | ICD-10-CM | POA: Diagnosis not present

## 2020-05-31 DIAGNOSIS — M25552 Pain in left hip: Secondary | ICD-10-CM | POA: Diagnosis not present

## 2020-05-31 DIAGNOSIS — M25652 Stiffness of left hip, not elsewhere classified: Secondary | ICD-10-CM | POA: Diagnosis not present

## 2020-06-03 DIAGNOSIS — M25552 Pain in left hip: Secondary | ICD-10-CM | POA: Diagnosis not present

## 2020-06-03 DIAGNOSIS — M25652 Stiffness of left hip, not elsewhere classified: Secondary | ICD-10-CM | POA: Diagnosis not present

## 2020-06-08 DIAGNOSIS — M25652 Stiffness of left hip, not elsewhere classified: Secondary | ICD-10-CM | POA: Diagnosis not present

## 2020-06-08 DIAGNOSIS — M25552 Pain in left hip: Secondary | ICD-10-CM | POA: Diagnosis not present

## 2020-06-10 DIAGNOSIS — M25552 Pain in left hip: Secondary | ICD-10-CM | POA: Diagnosis not present

## 2020-06-10 DIAGNOSIS — M25652 Stiffness of left hip, not elsewhere classified: Secondary | ICD-10-CM | POA: Diagnosis not present

## 2020-06-15 DIAGNOSIS — M25552 Pain in left hip: Secondary | ICD-10-CM | POA: Diagnosis not present

## 2020-06-15 DIAGNOSIS — M25652 Stiffness of left hip, not elsewhere classified: Secondary | ICD-10-CM | POA: Diagnosis not present

## 2020-06-16 ENCOUNTER — Other Ambulatory Visit: Payer: Self-pay

## 2020-06-16 ENCOUNTER — Ambulatory Visit (INDEPENDENT_AMBULATORY_CARE_PROVIDER_SITE_OTHER): Payer: Medicare Other

## 2020-06-16 DIAGNOSIS — Z23 Encounter for immunization: Secondary | ICD-10-CM | POA: Diagnosis not present

## 2020-06-17 DIAGNOSIS — M25652 Stiffness of left hip, not elsewhere classified: Secondary | ICD-10-CM | POA: Diagnosis not present

## 2020-06-17 DIAGNOSIS — M25552 Pain in left hip: Secondary | ICD-10-CM | POA: Diagnosis not present

## 2020-06-22 DIAGNOSIS — M25652 Stiffness of left hip, not elsewhere classified: Secondary | ICD-10-CM | POA: Diagnosis not present

## 2020-06-22 DIAGNOSIS — M25552 Pain in left hip: Secondary | ICD-10-CM | POA: Diagnosis not present

## 2020-06-23 DIAGNOSIS — Z23 Encounter for immunization: Secondary | ICD-10-CM | POA: Diagnosis not present

## 2020-06-24 DIAGNOSIS — M25652 Stiffness of left hip, not elsewhere classified: Secondary | ICD-10-CM | POA: Diagnosis not present

## 2020-06-24 DIAGNOSIS — M25552 Pain in left hip: Secondary | ICD-10-CM | POA: Diagnosis not present

## 2020-06-29 DIAGNOSIS — M25652 Stiffness of left hip, not elsewhere classified: Secondary | ICD-10-CM | POA: Diagnosis not present

## 2020-06-29 DIAGNOSIS — M25552 Pain in left hip: Secondary | ICD-10-CM | POA: Diagnosis not present

## 2020-06-29 DIAGNOSIS — Z96649 Presence of unspecified artificial hip joint: Secondary | ICD-10-CM | POA: Diagnosis not present

## 2020-07-01 DIAGNOSIS — M25652 Stiffness of left hip, not elsewhere classified: Secondary | ICD-10-CM | POA: Diagnosis not present

## 2020-07-01 DIAGNOSIS — M25552 Pain in left hip: Secondary | ICD-10-CM | POA: Diagnosis not present

## 2020-07-06 DIAGNOSIS — M25552 Pain in left hip: Secondary | ICD-10-CM | POA: Diagnosis not present

## 2020-07-06 DIAGNOSIS — M25652 Stiffness of left hip, not elsewhere classified: Secondary | ICD-10-CM | POA: Diagnosis not present

## 2020-07-13 DIAGNOSIS — M25652 Stiffness of left hip, not elsewhere classified: Secondary | ICD-10-CM | POA: Diagnosis not present

## 2020-07-13 DIAGNOSIS — M25552 Pain in left hip: Secondary | ICD-10-CM | POA: Diagnosis not present

## 2020-07-15 DIAGNOSIS — M25652 Stiffness of left hip, not elsewhere classified: Secondary | ICD-10-CM | POA: Diagnosis not present

## 2020-07-15 DIAGNOSIS — M25552 Pain in left hip: Secondary | ICD-10-CM | POA: Diagnosis not present

## 2020-07-21 DIAGNOSIS — M25652 Stiffness of left hip, not elsewhere classified: Secondary | ICD-10-CM | POA: Diagnosis not present

## 2020-07-21 DIAGNOSIS — M25552 Pain in left hip: Secondary | ICD-10-CM | POA: Diagnosis not present

## 2020-07-22 ENCOUNTER — Other Ambulatory Visit: Payer: Self-pay | Admitting: Primary Care

## 2020-07-22 DIAGNOSIS — I10 Essential (primary) hypertension: Secondary | ICD-10-CM

## 2020-08-03 DIAGNOSIS — M25652 Stiffness of left hip, not elsewhere classified: Secondary | ICD-10-CM | POA: Diagnosis not present

## 2020-08-03 DIAGNOSIS — M25552 Pain in left hip: Secondary | ICD-10-CM | POA: Diagnosis not present

## 2020-08-12 IMAGING — DX DG HIP (WITH OR WITHOUT PELVIS) 2-3V*L*
2 series · 2 of 2 positions shown · non-contrast
Comparison: None.

CLINICAL DATA: Left hip pain x1 month.

EXAM:
DG HIP (WITH OR WITHOUT PELVIS) 2-3V LEFT

[pelvis ap]
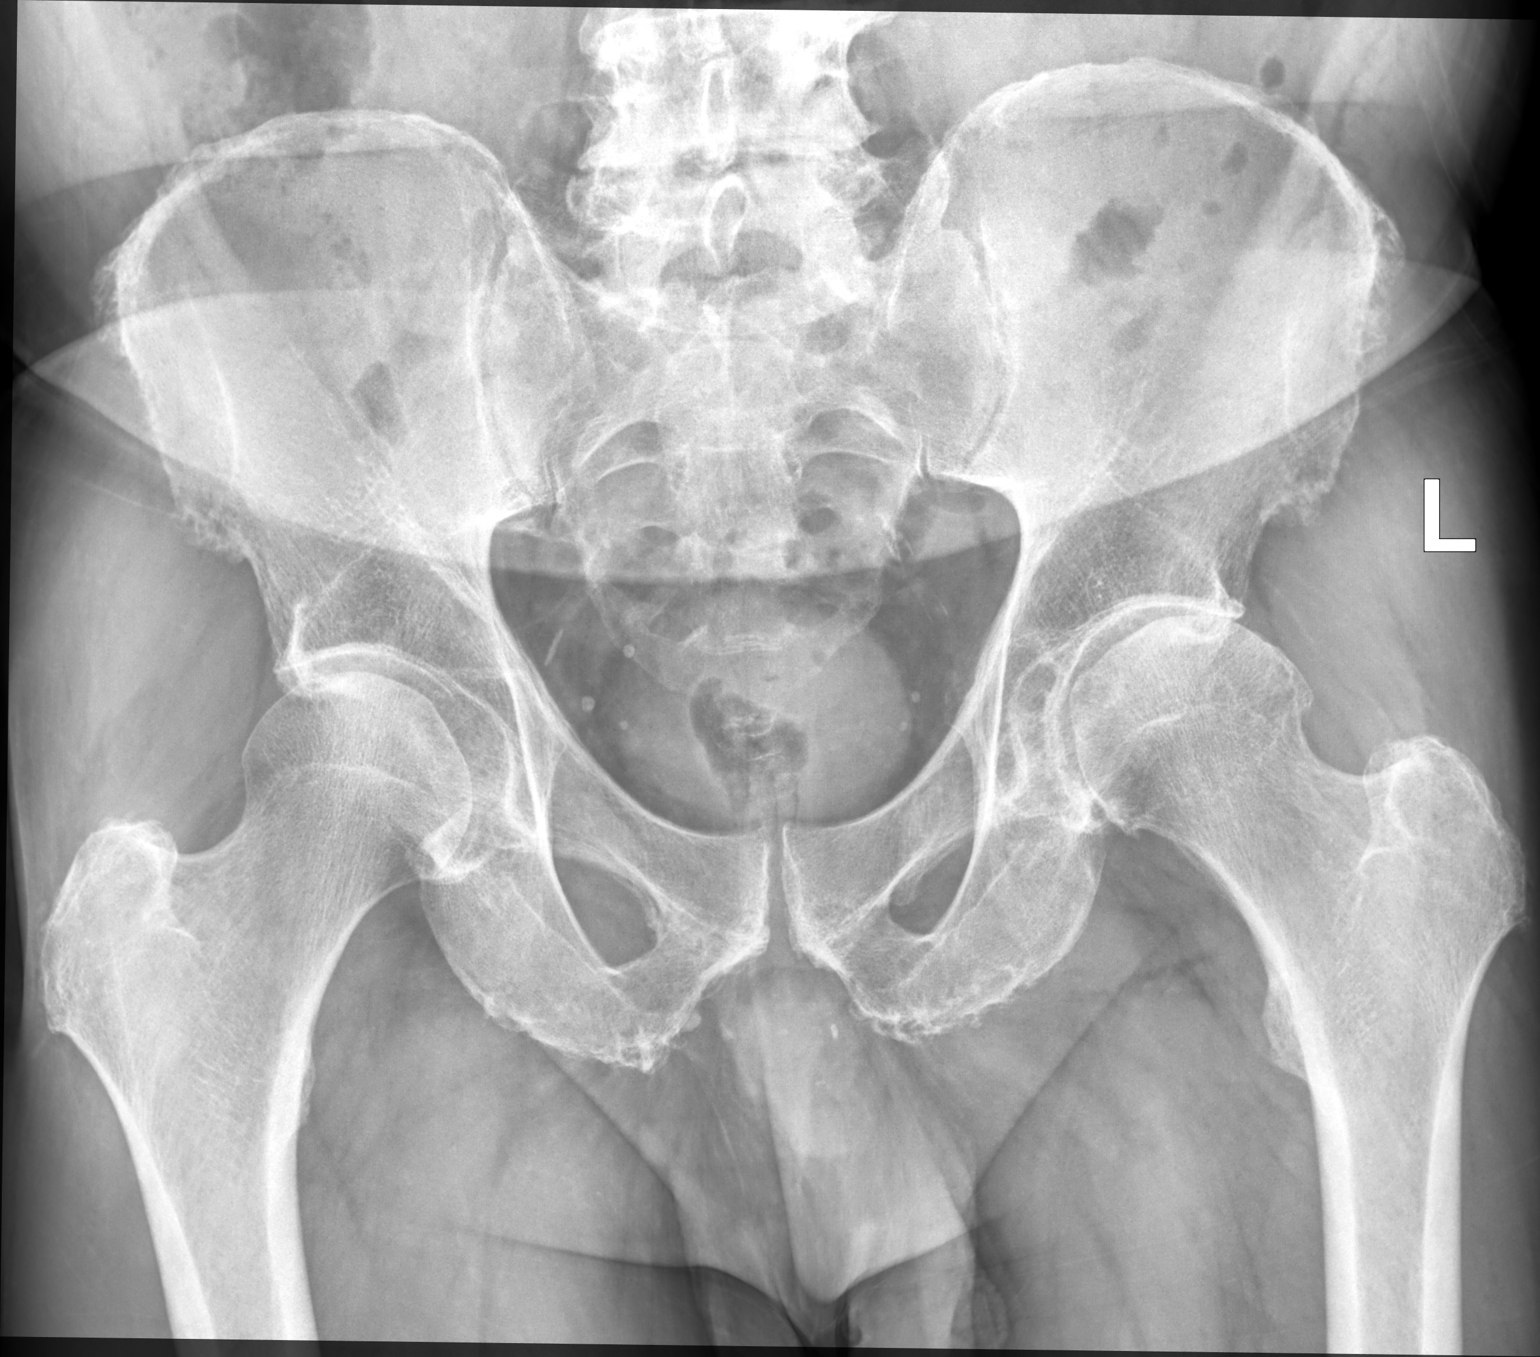

[hip frog leg lat]
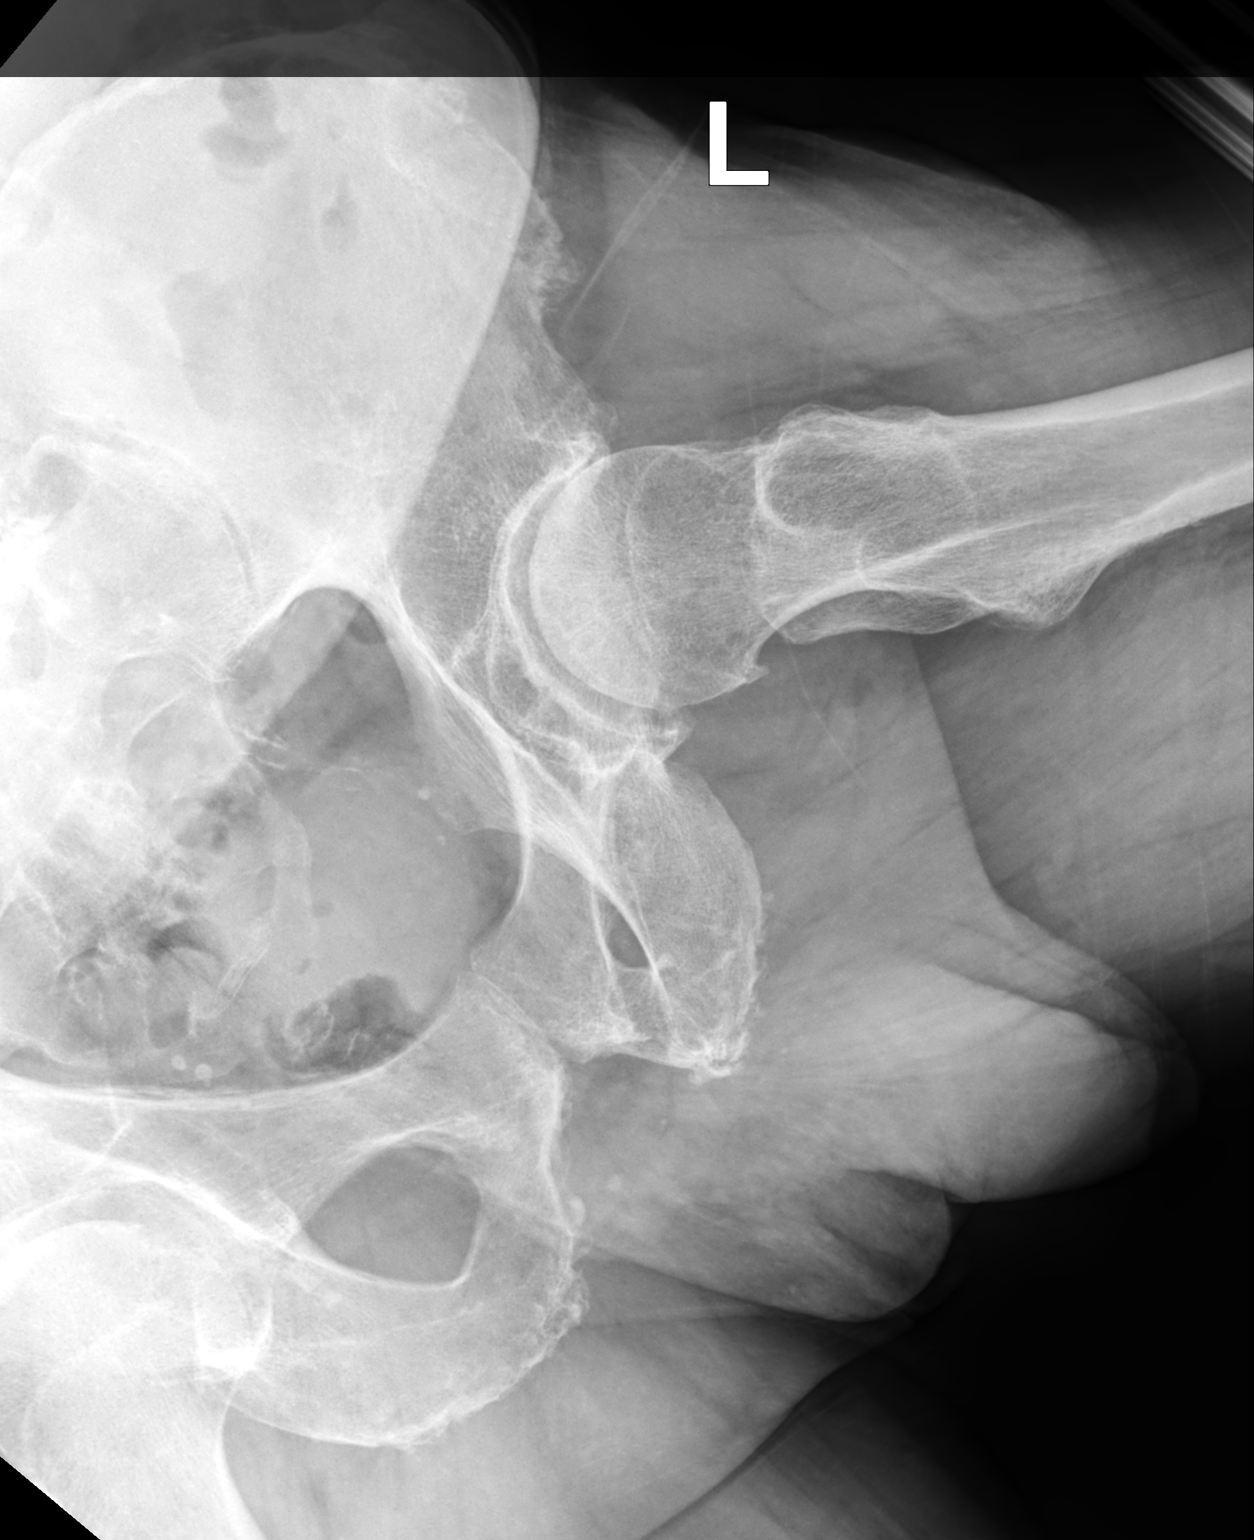

[2 of 2 positions shown; findings below may reference images not displayed]

FINDINGS: There are advanced degenerative changes of the left hip. There are
mild degenerative changes of the right hip. There is no acute
displaced fracture. No dislocation. Phleboliths project over the
patient's pelvis.
IMPRESSION: 1. No acute displaced fracture or dislocation.
2. Advanced osteoarthritis of the left hip.

## 2020-08-18 ENCOUNTER — Other Ambulatory Visit: Payer: Self-pay | Admitting: Primary Care

## 2020-08-18 DIAGNOSIS — I10 Essential (primary) hypertension: Secondary | ICD-10-CM

## 2020-08-31 DIAGNOSIS — Z96642 Presence of left artificial hip joint: Secondary | ICD-10-CM | POA: Diagnosis not present

## 2020-09-25 ENCOUNTER — Other Ambulatory Visit: Payer: Self-pay | Admitting: Primary Care

## 2020-09-25 DIAGNOSIS — I10 Essential (primary) hypertension: Secondary | ICD-10-CM

## 2020-10-01 ENCOUNTER — Other Ambulatory Visit: Payer: Self-pay | Admitting: Primary Care

## 2020-10-01 DIAGNOSIS — E785 Hyperlipidemia, unspecified: Secondary | ICD-10-CM

## 2020-10-04 ENCOUNTER — Telehealth: Payer: Self-pay

## 2020-10-04 ENCOUNTER — Ambulatory Visit: Payer: Medicare Other | Admitting: Primary Care

## 2020-10-04 NOTE — Telephone Encounter (Signed)
[place] [facility] - Client Nonclinical Telephone Record  [facility] Client [place] Night - Client Client Site [place] Physician Alma Friendly - NP Contact Type Call Who Is Calling Patient / Member / Family / Caregiver Caller Name [name] Caller Phone Number [phone] Patient Name David Turner Patient DOB 1951-07-23 Call Type Message Only Information Provided Reason for Call Request to Reschedule Office Appointment Initial Comment Caller states, pt has 1120am appt and pt is sick. Reschedule it. Disp. Time Disposition Final User 10/04/2020 7:36:59 AM General Information Provided Yes Lonia Farber Call Closed By: Lonia Farber Transaction Date/Time: 10/04/2020 7:35:00 AM (ET)   Apt has already been cancelled and RS.

## 2020-10-07 ENCOUNTER — Encounter: Payer: Self-pay | Admitting: Primary Care

## 2020-10-07 ENCOUNTER — Ambulatory Visit (INDEPENDENT_AMBULATORY_CARE_PROVIDER_SITE_OTHER): Payer: Medicare Other | Admitting: Primary Care

## 2020-10-07 ENCOUNTER — Other Ambulatory Visit: Payer: Self-pay

## 2020-10-07 VITALS — BP 128/70 | HR 69 | Temp 96.7°F | Wt 191.0 lb

## 2020-10-07 DIAGNOSIS — M10479 Other secondary gout, unspecified ankle and foot: Secondary | ICD-10-CM

## 2020-10-07 DIAGNOSIS — I10 Essential (primary) hypertension: Secondary | ICD-10-CM | POA: Diagnosis not present

## 2020-10-07 DIAGNOSIS — Z1211 Encounter for screening for malignant neoplasm of colon: Secondary | ICD-10-CM | POA: Diagnosis not present

## 2020-10-07 DIAGNOSIS — Z800 Family history of malignant neoplasm of digestive organs: Secondary | ICD-10-CM

## 2020-10-07 DIAGNOSIS — N529 Male erectile dysfunction, unspecified: Secondary | ICD-10-CM | POA: Diagnosis not present

## 2020-10-07 DIAGNOSIS — E785 Hyperlipidemia, unspecified: Secondary | ICD-10-CM | POA: Diagnosis not present

## 2020-10-07 DIAGNOSIS — Z125 Encounter for screening for malignant neoplasm of prostate: Secondary | ICD-10-CM

## 2020-10-07 DIAGNOSIS — M109 Gout, unspecified: Secondary | ICD-10-CM | POA: Diagnosis not present

## 2020-10-07 DIAGNOSIS — R739 Hyperglycemia, unspecified: Secondary | ICD-10-CM

## 2020-10-07 LAB — COMPREHENSIVE METABOLIC PANEL
ALT: 43 U/L (ref 0–53)
AST: 40 U/L — ABNORMAL HIGH (ref 0–37)
Albumin: 4.5 g/dL (ref 3.5–5.2)
Alkaline Phosphatase: 91 U/L (ref 39–117)
BUN: 9 mg/dL (ref 6–23)
CO2: 32 mEq/L (ref 19–32)
Calcium: 9.7 mg/dL (ref 8.4–10.5)
Chloride: 98 mEq/L (ref 96–112)
Creatinine, Ser: 0.76 mg/dL (ref 0.40–1.50)
GFR: 91.77 mL/min (ref >60.00)
Glucose, Bld: 115 mg/dL — ABNORMAL HIGH (ref 70–99)
Potassium: 4.6 mEq/L (ref 3.5–5.1)
Sodium: 135 mEq/L (ref 135–145)
Total Bilirubin: 0.8 mg/dL (ref 0.2–1.2)
Total Protein: 7.1 g/dL (ref 6.0–8.3)

## 2020-10-07 LAB — LIPID PANEL
Cholesterol: 126 mg/dL (ref 0–200)
HDL: 55.4 mg/dL (ref >39.00)
LDL Cholesterol: 60 mg/dL (ref 0–99)
NonHDL: 71.03
Total CHOL/HDL Ratio: 2
Triglycerides: 53 mg/dL (ref 0.0–149.0)
VLDL: 10.6 mg/dL (ref 0.0–40.0)

## 2020-10-07 LAB — URIC ACID: Uric Acid, Serum: 4.8 mg/dL (ref 4.0–7.8)

## 2020-10-07 LAB — CBC
HCT: 47.6 % (ref 39.0–52.0)
Hemoglobin: 15.5 g/dL (ref 13.0–17.0)
MCHC: 32.6 g/dL (ref 30.0–36.0)
MCV: 85.4 fl (ref 78.0–100.0)
Platelets: 214 10*3/uL (ref 150.0–400.0)
RBC: 5.57 Mil/uL (ref 4.22–5.81)
RDW: 14.6 % (ref 11.5–15.5)
WBC: 5 10*3/uL (ref 4.0–10.5)

## 2020-10-07 LAB — HEMOGLOBIN A1C: Hgb A1c MFr Bld: 6.5 % (ref 4.6–6.5)

## 2020-10-07 LAB — PSA, MEDICARE: PSA: 2.24 ng/ml (ref 0.10–4.00)

## 2020-10-07 MED ORDER — ALLOPURINOL 300 MG PO TABS
300.0000 mg | ORAL_TABLET | Freq: Every day | ORAL | 3 refills | Status: DC
Start: 2020-10-07 — End: 2020-10-28

## 2020-10-07 MED ORDER — TADALAFIL 20 MG PO TABS: ORAL_TABLET | ORAL | 0 refills | Status: DC

## 2020-10-07 MED ORDER — VALSARTAN-HYDROCHLOROTHIAZIDE 80-12.5 MG PO TABS: 1.0000 | ORAL_TABLET | Freq: Every day | ORAL | 0 refills | Status: DC

## 2020-10-07 NOTE — Assessment & Plan Note (Signed)
In sister.  From chart review it appears that he is overdue for repeat colonoscopy. He was due in 2016.  Referral placed to GI.

## 2020-10-07 NOTE — Assessment & Plan Note (Signed)
BP controlled in the office today, but it sounds like his lisinopril is causing a cough. He's had this chronic dry cough for years, began about the same time he started lisinopril.  Stop lisinopril-HCTZ 20-12.5 mg. Stop separate HCTZ 12.5 mg.  Start valsartan-HCTZ 80-12.5 mg.   We will plan to see him back in 2 weeks for BP check and follow up from cough.

## 2020-10-07 NOTE — Patient Instructions (Signed)
Stop by the lab prior to leaving today. I will notify you of your results once received.   Stop taking lisinopril-hydrochlorothiazide 20-12.5 mg for blood pressure. This is likely causing your cough.  Stop taking hydrochlorothiazide 12.5 mg for blood pressure.  Start taking valsartan-hydrochlorothiazide 80-12.5 mg for blood pressure.  Monitor your blood pressure at home as discussed.  You will be contacted regarding your referral to GI for the colonoscopy.  Please let us know if you have not been contacted within two weeks.    Please schedule a follow up visit to meet back with me in 2-3 weeks for blood pressure check.   It was a pleasure to see you today!

## 2020-10-07 NOTE — Assessment & Plan Note (Signed)
Compliant to atorvastatin 20 mg, repeat lipid panel pending. Continue same.

## 2020-10-07 NOTE — Assessment & Plan Note (Signed)
No recent use of Cialis, uses infrequently. Refill provided.

## 2020-10-07 NOTE — Progress Notes (Signed)
Subjective:    Patient ID: David Turner, male    DOB: 09-Sep-1951, 69 y.o.   MRN: [id number]  HPI  This visit occurred during the SARS-CoV-2 public health emergency.  Safety protocols were in place, including screening questions prior to the visit, additional usage of staff PPE, and extensive cleaning of exam room while observing appropriate contact time as indicated for disinfecting solutions.   David Turner is a 70 year old male with a history of hypertension, gout, hyperlipidemia, erectile dysfunction who presents today for follow up. He is overdue for repeat colonoscopy.   1) Essential Hypertension: Currently managed on HCTZ 12.5 mg, lisinopril-HCTZ 20-12.5 mg. He does experience a tickle to his throat with a chronic, intermittent cough.   He does not check his BP at home. He denies chest pain, dizziness, shortness of breath.   BP Readings from Last 3 Encounters:  10/07/20 128/70  11/05/19 140/76  11/03/19 (!) 144/90   2) Gout: Currently managed on allopurinol 300 mg daily for prevention. No recent gout flare, cannot recall last flare.   3) Hyperlipidemia: Currently managed on atorvastatin 20 mg. Due for repeat lipid panel.  4) Chronic Hip Pain: S/P left hip replacement in September 2021 per Dr. Harlow Mares. Currently managed on Meloxicam 15 mg for which he's been taking everyday since surgery. He denies pain. He is doing much better.    Review of Systems  Eyes: Negative for visual disturbance.  Respiratory: Positive for cough. Negative for shortness of breath.        Chronic cough with tickle to the throat  Cardiovascular: Negative for chest pain.  Musculoskeletal: Negative for arthralgias.  Neurological: Negative for headaches.       Past Medical History:  Diagnosis Date  . Gout   . Hyperlipidemia   . Hypertension   . Wears dentures    partials, upper and lower     Social History   Socioeconomic History  . Marital status: Married    Spouse name: Not on file  . Number  of children: Not on file  . Years of education: Not on file  . Highest education level: Not on file  Occupational History  . Not on file  Tobacco Use  . Smoking status: Former Smoker    Quit date: 2002    Years since quitting: 20.0  . Smokeless tobacco: Never Used  . Tobacco comment: quit 2002  Vaping Use  . Vaping Use: Never used  Substance and Sexual Activity  . Alcohol use: Yes    Alcohol/week: 0.0 standard drinks    Comment: 6-8 beers  . Drug use: Never  . Sexual activity: Not on file  Other Topics Concern  . Not on file  Social History Narrative   Married.   Travels to and from [place] often- son still lives there.   Social Determinants of Health   Financial Resource Strain: Not on file  Food Insecurity: Not on file  Transportation Needs: Not on file  Physical Activity: Not on file  Stress: Not on file  Social Connections: Not on file  Intimate Partner Violence: Not on file    Past Surgical History:  Procedure Laterality Date  . CATARACT EXTRACTION W/PHACO Left 02/19/2019   Procedure: CATARACT EXTRACTION PHACO AND INTRAOCULAR LENS PLACEMENT ([place])  LEFT;  Surgeon: Leandrew Koyanagi, MD;  Location: [place];  Service: Ophthalmology;  Laterality: Left;  . left knee surgeries  1983 1993, 2008  . TOTAL HIP ARTHROPLASTY Left 05/19/2020    Family History  Problem Relation Age of Onset  . Heart disease Father   . Cancer Sister 6       colon  . Heart disease Brother     No Known Allergies  Current Outpatient Medications on File Prior to Visit  Medication Sig Dispense Refill  . allopurinol (ZYLOPRIM) 300 MG tablet Take 1 tablet (300 mg total) by mouth daily. For gout prevention. 90 tablet 1  . aspirin 81 MG tablet Take 81 mg by mouth daily.    Marland Kitchen atorvastatin (LIPITOR) 20 MG tablet TAKE 1 TABLET(20 MG) BY MOUTH EVERY EVENING FOR CHOLESTEROL 90 tablet 1  . Garlic 7741 MG CAPS Take 2 capsules by mouth.    . hydrochlorothiazide (HYDRODIURIL) 12.5 MG  tablet TAKE 1 TABLET BY MOUTH DAILY FOR BLOOD PRESSURE. TAKE WITH LISINOPRIL-HCTZ 20-12.5 MG 15 tablet 0  . latanoprost (XALATAN) 0.005 % ophthalmic solution INT 1 GTT IN OU QD HS  3  . lisinopril-hydrochlorothiazide (ZESTORETIC) 20-12.5 MG tablet Take 1 tablet by mouth daily.    . meloxicam (MOBIC) 15 MG tablet Take 15 mg by mouth daily.    . Multiple Vitamin (MULTIVITAMIN) tablet Take 1 tablet by mouth daily.    . Omega-3 Fatty Acids (FISH OIL) 1000 MG CAPS Take by mouth.    . tadalafil (CIALIS) 20 MG tablet Take 0.5-1 tablets (10-20 mg total) by mouth every other day as needed for erectile dysfunction. 5 tablet 11   No current facility-administered medications on file prior to visit.    BP 128/70 (BP Location: Right Arm, Patient Position: Sitting)   Pulse 69   Temp (!) 96.7 F (35.9 C) (Temporal)   Wt 191 lb (86.6 kg)   SpO2 96%   BMI 28.21 kg/m    Objective:   Physical Exam Constitutional:      Appearance: He is well-nourished.  Cardiovascular:     Rate and Rhythm: Normal rate and regular rhythm.  Pulmonary:     Effort: Pulmonary effort is normal.     Breath sounds: Normal breath sounds.  Abdominal:     General: Bowel sounds are normal.     Palpations: Abdomen is soft.     Tenderness: There is no abdominal tenderness.  Musculoskeletal:     Cervical back: Neck supple.  Skin:    General: Skin is warm and dry.  Neurological:     Mental Status: He is oriented to person, place, and time.  Psychiatric:        Mood and Affect: Mood and affect and mood normal.            Assessment & Plan:

## 2020-10-07 NOTE — Assessment & Plan Note (Signed)
No gout flare in years, continue allopurinol 300 mg.  Repeat uric acid pending.

## 2020-10-07 NOTE — Addendum Note (Signed)
Addended by: Pleas Koch on: 10/07/2020 12:05 PM   Modules accepted: Orders

## 2020-10-08 ENCOUNTER — Telehealth (INDEPENDENT_AMBULATORY_CARE_PROVIDER_SITE_OTHER): Payer: Self-pay | Admitting: Gastroenterology

## 2020-10-08 ENCOUNTER — Other Ambulatory Visit: Payer: Self-pay

## 2020-10-08 ENCOUNTER — Telehealth: Payer: Self-pay

## 2020-10-08 DIAGNOSIS — Z1211 Encounter for screening for malignant neoplasm of colon: Secondary | ICD-10-CM

## 2020-10-08 DIAGNOSIS — E119 Type 2 diabetes mellitus without complications: Secondary | ICD-10-CM

## 2020-10-08 MED ORDER — NA SULFATE-K SULFATE-MG SULF 17.5-3.13-1.6 GM/177ML PO SOLN: 1.0000 | Freq: Once | ORAL | 0 refills | Status: AC

## 2020-10-08 MED ORDER — METFORMIN HCL ER 500 MG PO TB24
500.0000 mg | ORAL_TABLET | Freq: Every day | ORAL | 3 refills | Status: DC
Start: 2020-10-08 — End: 2021-09-25

## 2020-10-08 NOTE — Progress Notes (Signed)
Gastroenterology Pre-Procedure Review  Request Date: Thursday 10/28/20 Requesting Physician: Dr. Bonna Gains  PATIENT REVIEW QUESTIONS: The patient responded to the following health history questions as indicated:    1. Are you having any GI issues? no 2. Do you have a personal history of Polyps? no 3. Do you have a family history of Colon Cancer or Polyps? yes (sister colon cancer) 4. Diabetes Mellitus? no 5. Joint replacements in the past 12 months?yes (Hip Replacement last year at [facility]) 6. Major health problems in the past 3 months?no 7. Any artificial heart valves, MVP, or defibrillator?no    MEDICATIONS & ALLERGIES:    Patient reports the following regarding taking any anticoagulation/antiplatelet therapy:   Plavix, Coumadin, Eliquis, Xarelto, Lovenox, Pradaxa, Brilinta, or Effient? no Aspirin?yes 81 mg daily  Patient confirms/reports the following medications:  Current Outpatient Medications  Medication Sig Dispense Refill  . allopurinol (ZYLOPRIM) 300 MG tablet Take 1 tablet (300 mg total) by mouth daily. For gout prevention. 90 tablet 3  . aspirin 81 MG tablet Take 81 mg by mouth daily.    Marland Kitchen atorvastatin (LIPITOR) 20 MG tablet TAKE 1 TABLET(20 MG) BY MOUTH EVERY EVENING FOR CHOLESTEROL 90 tablet 1  . Garlic 3976 MG CAPS Take 2 capsules by mouth.    . latanoprost (XALATAN) 0.005 % ophthalmic solution INT 1 GTT IN OU QD HS  3  . Multiple Vitamin (MULTIVITAMIN) tablet Take 1 tablet by mouth daily.    . Omega-3 Fatty Acids (FISH OIL) 1000 MG CAPS Take by mouth.    . valsartan-hydrochlorothiazide (DIOVAN HCT) 80-12.5 MG tablet Take 1 tablet by mouth daily. For blood pressure. 30 tablet 0  . tadalafil (CIALIS) 20 MG tablet Take 1 tablet by mouth 30 minutes prior to intercourse as needed. (Patient not taking: Reported on 10/08/2020) 30 tablet 0   No current facility-administered medications for this visit.    Patient confirms/reports the following allergies:  No Known  Allergies  No orders of the defined types were placed in this encounter.   AUTHORIZATION INFORMATION Primary Insurance: 1D#: Group #:  Secondary Insurance: 1D#: Group #:  SCHEDULE INFORMATION: Date: 10/28/20 Time: Location:[facility]

## 2020-10-08 NOTE — Progress Notes (Signed)
Gastroenterology Pre-Procedure Review  Request Date: Thursday 10/28/20 Requesting Physician: Dr. Bonna Gains  PATIENT REVIEW QUESTIONS: The patient responded to the following health history questions as indicated:    1. Are you having any GI issues? no 2. Do you have a personal history of Polyps? Patient states yes last colonoscopy was 2013 no polyps were noted  3. Do you have a family history of Colon Cancer or Polyps? yes (sister colon cancer) 4. Diabetes Mellitus? no 5. Joint replacements in the past 12 months?yes (hip replacement at [facility] last year) 6. Major health problems in the past 3 months?no 7. Any artificial heart valves, MVP, or defibrillator?no    MEDICATIONS & ALLERGIES:    Patient reports the following regarding taking any anticoagulation/antiplatelet therapy:   Plavix, Coumadin, Eliquis, Xarelto, Lovenox, Pradaxa, Brilinta, or Effient? no Aspirin? yes (81 mg daily)  Patient confirms/reports the following medications:  Current Outpatient Medications  Medication Sig Dispense Refill  . allopurinol (ZYLOPRIM) 300 MG tablet Take 1 tablet (300 mg total) by mouth daily. For gout prevention. 90 tablet 3  . aspirin 81 MG tablet Take 81 mg by mouth daily.    Marland Kitchen atorvastatin (LIPITOR) 20 MG tablet TAKE 1 TABLET(20 MG) BY MOUTH EVERY EVENING FOR CHOLESTEROL 90 tablet 1  . Garlic 7614 MG CAPS Take 2 capsules by mouth.    . latanoprost (XALATAN) 0.005 % ophthalmic solution INT 1 GTT IN OU QD HS  3  . Multiple Vitamin (MULTIVITAMIN) tablet Take 1 tablet by mouth daily.    . Na Sulfate-K Sulfate-Mg Sulf 17.5-3.13-1.6 GM/177ML SOLN Take 1 kit by mouth once for 1 dose. 354 mL 0  . Omega-3 Fatty Acids (FISH OIL) 1000 MG CAPS Take by mouth.    . tadalafil (CIALIS) 20 MG tablet Take 1 tablet by mouth 30 minutes prior to intercourse as needed. (Patient not taking: Reported on 10/08/2020) 30 tablet 0  . valsartan-hydrochlorothiazide (DIOVAN HCT) 80-12.5 MG tablet Take 1 tablet by mouth daily. For  blood pressure. 30 tablet 0   No current facility-administered medications for this visit.    Patient confirms/reports the following allergies:  No Known Allergies  No orders of the defined types were placed in this encounter.   AUTHORIZATION INFORMATION Primary Insurance: 1D#: Group #:  Secondary Insurance: 1D#: Group #:  SCHEDULE INFORMATION: Date: 10/28/20 Time: Location:[facility]

## 2020-10-08 NOTE — Telephone Encounter (Signed)
[name] (Key: Michaell Cowing) Rx #: [id number] Tadalafil 20MG  tablets

## 2020-10-11 NOTE — Telephone Encounter (Signed)
PA Denied  Patient called and made aware that he will have to pay out of pocket for this medication. Patient verbalized understanding.

## 2020-10-18 DIAGNOSIS — H40003 Preglaucoma, unspecified, bilateral: Secondary | ICD-10-CM | POA: Diagnosis not present

## 2020-10-25 DIAGNOSIS — H40003 Preglaucoma, unspecified, bilateral: Secondary | ICD-10-CM | POA: Diagnosis not present

## 2020-10-25 LAB — HM DIABETES EYE EXAM

## 2020-10-26 ENCOUNTER — Ambulatory Visit (INDEPENDENT_AMBULATORY_CARE_PROVIDER_SITE_OTHER): Payer: Medicare Other | Admitting: Primary Care

## 2020-10-26 ENCOUNTER — Other Ambulatory Visit
Admission: RE | Admit: 2020-10-26 | Discharge: 2020-10-26 | Disposition: A | Discharge status: Home / Self Care | Payer: Medicare Other | Source: Ambulatory Visit | Attending: Gastroenterology | Admitting: Gastroenterology

## 2020-10-26 ENCOUNTER — Other Ambulatory Visit: Payer: Self-pay

## 2020-10-26 ENCOUNTER — Encounter: Payer: Self-pay | Admitting: Primary Care

## 2020-10-26 DIAGNOSIS — Z20822 Contact with and (suspected) exposure to covid-19: Secondary | ICD-10-CM | POA: Insufficient documentation

## 2020-10-26 DIAGNOSIS — E119 Type 2 diabetes mellitus without complications: Secondary | ICD-10-CM

## 2020-10-26 DIAGNOSIS — Z01812 Encounter for preprocedural laboratory examination: Secondary | ICD-10-CM | POA: Insufficient documentation

## 2020-10-26 DIAGNOSIS — I10 Essential (primary) hypertension: Secondary | ICD-10-CM | POA: Diagnosis not present

## 2020-10-26 DIAGNOSIS — E1165 Type 2 diabetes mellitus with hyperglycemia: Secondary | ICD-10-CM | POA: Insufficient documentation

## 2020-10-26 LAB — SARS CORONAVIRUS 2 (TAT 6-24 HRS): SARS Coronavirus 2: NEGATIVE

## 2020-10-26 MED ORDER — VALSARTAN-HYDROCHLOROTHIAZIDE 80-12.5 MG PO TABS: 1.0000 | ORAL_TABLET | Freq: Every day | ORAL | 3 refills | Status: DC

## 2020-10-26 NOTE — Progress Notes (Signed)
Subjective:    Patient ID: David Turner, male    DOB: July 05, 1951, 70 y.o.   MRN: [id number]  HPI  This visit occurred during the SARS-CoV-2 public health emergency.  Safety protocols were in place, including screening questions prior to the visit, additional usage of staff PPE, and extensive cleaning of exam room while observing appropriate contact time as indicated for disinfecting solutions.   David Turner is a 70 year old male with a history of hypertension, hyperlipidemia who presents today for follow up of hypertension.  He was last evaluated 10/07/20 for CPE, BP well controlled by lisinopril was suspected to be causing a chronic cough. We discontinued his lisinopril-HCTZ 20-12.5 mg and HCTZ 12.5 mg. We started valsartan-HCTZ 80-12.5 mg. He is here today for follow up.  Since his last visit he's checking his BP at home with two different machines and is getting readings ranging 120's-160's/60's-70's. He has noticed an improvement in his cough and the tickle in his throat.   He denies chest pain.   BP Readings from Last 3 Encounters:  10/26/20 130/68  10/07/20 128/70  11/05/19 140/76   Wt Readings from Last 3 Encounters:  10/26/20 192 lb (87.1 kg)  10/07/20 191 lb (86.6 kg)  11/05/19 188 lb 12 oz (85.6 kg)     Review of Systems  Eyes: Negative for visual disturbance.  Respiratory: Negative for cough and shortness of breath.   Cardiovascular: Negative for chest pain.  Neurological: Negative for headaches.       Past Medical History:  Diagnosis Date  . Gout   . Hyperlipidemia   . Hypertension   . Wears dentures    partials, upper and lower     Social History   Socioeconomic History  . Marital status: Married    Spouse name: Not on file  . Number of children: Not on file  . Years of education: Not on file  . Highest education level: Not on file  Occupational History  . Not on file  Tobacco Use  . Smoking status: Former Smoker    Quit date: 2002    Years  since quitting: 20.1  . Smokeless tobacco: Never Used  . Tobacco comment: quit 2002  Vaping Use  . Vaping Use: Never used  Substance and Sexual Activity  . Alcohol use: Yes    Alcohol/week: 0.0 standard drinks    Comment: 6-8 beers  . Drug use: Never  . Sexual activity: Not on file  Other Topics Concern  . Not on file  Social History Narrative   Married.   Travels to and from [place] often- son still lives there.   Social Determinants of Health   Financial Resource Strain: Not on file  Food Insecurity: Not on file  Transportation Needs: Not on file  Physical Activity: Not on file  Stress: Not on file  Social Connections: Not on file  Intimate Partner Violence: Not on file    Past Surgical History:  Procedure Laterality Date  . CATARACT EXTRACTION W/PHACO Left 02/19/2019   Procedure: CATARACT EXTRACTION PHACO AND INTRAOCULAR LENS PLACEMENT ([place])  LEFT;  Surgeon: Leandrew Koyanagi, MD;  Location: [facility];  Service: Ophthalmology;  Laterality: Left;  . left knee surgeries  1983 1993, 2008  . TOTAL HIP ARTHROPLASTY Left 05/19/2020    Family History  Problem Relation Age of Onset  . Heart disease Father   . Cancer Sister 60       colon  . Heart disease Brother  No Known Allergies  Current Outpatient Medications on File Prior to Visit  Medication Sig Dispense Refill  . allopurinol (ZYLOPRIM) 300 MG tablet Take 1 tablet (300 mg total) by mouth daily. For gout prevention. 90 tablet 3  . aspirin 81 MG tablet Take 81 mg by mouth daily.    Marland Kitchen atorvastatin (LIPITOR) 20 MG tablet TAKE 1 TABLET(20 MG) BY MOUTH EVERY EVENING FOR CHOLESTEROL 90 tablet 1  . Garlic 4081 MG CAPS Take 2 capsules by mouth.    . latanoprost (XALATAN) 0.005 % ophthalmic solution INT 1 GTT IN OU QD HS  3  . metFORMIN (GLUCOPHAGE-XR) 500 MG 24 hr tablet Take 1 tablet (500 mg total) by mouth daily with breakfast. For diabetes. 90 tablet 3  . Multiple Vitamin (MULTIVITAMIN) tablet Take 1  tablet by mouth daily.    . Omega-3 Fatty Acids (FISH OIL) 1000 MG CAPS Take by mouth.    . valsartan-hydrochlorothiazide (DIOVAN HCT) 80-12.5 MG tablet Take 1 tablet by mouth daily. For blood pressure. 30 tablet 0  . tadalafil (CIALIS) 20 MG tablet Take 1 tablet by mouth 30 minutes prior to intercourse as needed. (Patient not taking: Reported on 10/26/2020) 30 tablet 0   No current facility-administered medications on file prior to visit.    BP 130/68   Pulse 73   Temp 98.2 F (36.8 C) (Temporal)   Ht 5\' 9"  (1.753 m)   Wt 192 lb (87.1 kg)   SpO2 97%   BMI 28.35 kg/m    Objective:   Physical Exam Constitutional:      Appearance: He is well-nourished.  Cardiovascular:     Rate and Rhythm: Normal rate and regular rhythm.  Pulmonary:     Effort: Pulmonary effort is normal.     Breath sounds: Normal breath sounds.  Musculoskeletal:     Cervical back: Neck supple.  Skin:    General: Skin is warm and dry.  Psychiatric:        Mood and Affect: Mood and affect normal.            Assessment & Plan:

## 2020-10-26 NOTE — Assessment & Plan Note (Signed)
Controlled in the office today since switching to valsartan-HCTZ 80-12.5 mg. Cough has improved.   Continue current regimen.

## 2020-10-26 NOTE — Patient Instructions (Signed)
Continue Metformin XR 500 mg daily for diabetes.  Continue to atorvastatin 20 mg for heart protection.   Start exercising. You should be getting 150 minutes of moderate intensity exercise weekly.  Continue to work on a healthy diet. Ensure you are consuming 64 ounces of water daily.  Please schedule a follow up appointment in 3 months.  It was a pleasure to see you today!   Diabetes Mellitus and Nutrition, Adult When you have diabetes, or diabetes mellitus, it is very important to have healthy eating habits because your blood sugar (glucose) levels are greatly affected by what you eat and drink. Eating healthy foods in the right amounts, at about the same times every day, can help you:  Control your blood glucose.  Lower your risk of heart disease.  Improve your blood pressure.  Reach or maintain a healthy weight. What can affect my meal plan? Every person with diabetes is different, and each person has different needs for a meal plan. Your health care provider may recommend that you work with a dietitian to make a meal plan that is best for you. Your meal plan may vary depending on factors such as:  The calories you need.  The medicines you take.  Your weight.  Your blood glucose, blood pressure, and cholesterol levels.  Your activity level.  Other health conditions you have, such as heart or kidney disease. How do carbohydrates affect me? Carbohydrates, also called carbs, affect your blood glucose level more than any other type of food. Eating carbs naturally raises the amount of glucose in your blood. Carb counting is a method for keeping track of how many carbs you eat. Counting carbs is important to keep your blood glucose at a healthy level, especially if you use insulin or take certain oral diabetes medicines. It is important to know how many carbs you can safely have in each meal. This is different for every person. Your dietitian can help you calculate how many carbs  you should have at each meal and for each snack. How does alcohol affect me? Alcohol can cause a sudden decrease in blood glucose (hypoglycemia), especially if you use insulin or take certain oral diabetes medicines. Hypoglycemia can be a life-threatening condition. Symptoms of hypoglycemia, such as sleepiness, dizziness, and confusion, are similar to symptoms of having too much alcohol.  Do not drink alcohol if: ? Your health care provider tells you not to drink. ? You are pregnant, may be pregnant, or are planning to become pregnant.  If you drink alcohol: ? Do not drink on an empty stomach. ? Limit how much you use to:  0-1 drink a day for women.  0-2 drinks a day for men. ? Be aware of how much alcohol is in your drink. In the [place] one drink equals one 12 oz bottle of beer (355 mL), one 5 oz glass of wine (148 mL), or one 1 oz glass of hard liquor (44 mL). ? Keep yourself hydrated with water, diet soda, or unsweetened iced tea.  Keep in mind that regular soda, juice, and other mixers may contain a lot of sugar and must be counted as carbs. What are tips for following this plan? Reading food labels  Start by checking the serving size on the "Nutrition Facts" label of packaged foods and drinks. The amount of calories, carbs, fats, and other nutrients listed on the label is based on one serving of the item. Many items contain more than one serving per package.  Check  the total grams (g) of carbs in one serving. You can calculate the number of servings of carbs in one serving by dividing the total carbs by 15. For example, if a food has 30 g of total carbs per serving, it would be equal to 2 servings of carbs.  Check the number of grams (g) of saturated fats and trans fats in one serving. Choose foods that have a low amount or none of these fats.  Check the number of milligrams (mg) of salt (sodium) in one serving. Most people should limit total sodium intake to less than 2,300 mg per  day.  Always check the nutrition information of foods labeled as "low-fat" or "nonfat." These foods may be higher in added sugar or refined carbs and should be avoided.  Talk to your dietitian to identify your daily goals for nutrients listed on the label. Shopping  Avoid buying canned, pre-made, or processed foods. These foods tend to be high in fat, sodium, and added sugar.  Shop around the outside edge of the grocery store. This is where you will most often find fresh fruits and vegetables, bulk grains, fresh meats, and fresh dairy. Cooking  Use low-heat cooking methods, such as baking, instead of high-heat cooking methods like deep frying.  Cook using healthy oils, such as olive, canola, or sunflower oil.  Avoid cooking with butter, cream, or high-fat meats. Meal planning  Eat meals and snacks regularly, preferably at the same times every day. Avoid going long periods of time without eating.  Eat foods that are high in fiber, such as fresh fruits, vegetables, beans, and whole grains. Talk with your dietitian about how many servings of carbs you can eat at each meal.  Eat 4-6 oz (112-168 g) of lean protein each day, such as lean meat, chicken, fish, eggs, or tofu. One ounce (oz) of lean protein is equal to: ? 1 oz (28 g) of meat, chicken, or fish. ? 1 egg. ?  cup (62 g) of tofu.  Eat some foods each day that contain healthy fats, such as avocado, nuts, seeds, and fish.   What foods should I eat? Fruits Berries. Apples. Oranges. Peaches. Apricots. Plums. Grapes. Mango. Papaya. Pomegranate. Kiwi. Cherries. Vegetables Lettuce. Spinach. Leafy greens, including kale, chard, collard greens, and mustard greens. Beets. Cauliflower. Cabbage. Broccoli. Carrots. Green beans. Tomatoes. Peppers. Onions. Cucumbers. Brussels sprouts. Grains Whole grains, such as whole-wheat or whole-grain bread, crackers, tortillas, cereal, and pasta. Unsweetened oatmeal. Quinoa. Brown or wild rice. Meats  and other proteins Seafood. Poultry without skin. Lean cuts of poultry and beef. Tofu. Nuts. Seeds. Dairy Low-fat or fat-free dairy products such as milk, yogurt, and cheese. The items listed above may not be a complete list of foods and beverages you can eat. Contact a dietitian for more information. What foods should I avoid? Fruits Fruits canned with syrup. Vegetables Canned vegetables. Frozen vegetables with butter or cream sauce. Grains Refined white flour and flour products such as bread, pasta, snack foods, and cereals. Avoid all processed foods. Meats and other proteins Fatty cuts of meat. Poultry with skin. Breaded or fried meats. Processed meat. Avoid saturated fats. Dairy Full-fat yogurt, cheese, or milk. Beverages Sweetened drinks, such as soda or iced tea. The items listed above may not be a complete list of foods and beverages you should avoid. Contact a dietitian for more information. Questions to ask a health care provider  Do I need to meet with a diabetes educator?  Do I need to meet  with a dietitian?  What number can I call if I have questions?  When are the best times to check my blood glucose? Where to find more information:  American Diabetes Association: diabetes.org  Academy of Nutrition and Dietetics: www.eatright.[employer] of Diabetes and Digestive and Kidney Diseases: [employer]  Association of Diabetes Care and Education Specialists: www.diabeteseducator.org Summary  It is important to have healthy eating habits because your blood sugar (glucose) levels are greatly affected by what you eat and drink.  A healthy meal plan will help you control your blood glucose and maintain a healthy lifestyle.  Your health care provider may recommend that you work with a dietitian to make a meal plan that is best for you.  Keep in mind that carbohydrates (carbs) and alcohol have immediate effects on your blood glucose levels. It is  important to count carbs and to use alcohol carefully. This information is not intended to replace advice given to you by your health care provider. Make sure you discuss any questions you have with your health care provider. Document Revised: 08/05/2019 Document Reviewed: 08/05/2019 Elsevier Patient Education  2021 [employer].

## 2020-10-26 NOTE — Assessment & Plan Note (Signed)
New diagnosis with A1C of 6.5 mg, compliant to Metformin XR 500 mg daily, continue same.   Managed on statin and ARB. Pneumonia vaccine UTD.   Follow up in 3 months.

## 2020-10-27 ENCOUNTER — Other Ambulatory Visit: Payer: Self-pay | Admitting: Primary Care

## 2020-10-27 DIAGNOSIS — M109 Gout, unspecified: Secondary | ICD-10-CM

## 2020-10-28 ENCOUNTER — Ambulatory Visit: Payer: Medicare Other | Admitting: Certified Registered Nurse Anesthetist

## 2020-10-28 ENCOUNTER — Ambulatory Visit
Admission: RE | Admit: 2020-10-28 | Discharge: 2020-10-28 | Disposition: A | Discharge status: Home / Self Care | Payer: Medicare Other | Attending: Gastroenterology | Admitting: Gastroenterology

## 2020-10-28 ENCOUNTER — Other Ambulatory Visit: Payer: Self-pay

## 2020-10-28 ENCOUNTER — Encounter: Payer: Self-pay | Admitting: Gastroenterology

## 2020-10-28 ENCOUNTER — Encounter
Admission: RE | Disposition: A | Discharge status: Home / Self Care | Payer: Self-pay | Source: Home / Self Care | Attending: Gastroenterology

## 2020-10-28 DIAGNOSIS — Z7984 Long term (current) use of oral hypoglycemic drugs: Secondary | ICD-10-CM | POA: Insufficient documentation

## 2020-10-28 DIAGNOSIS — E785 Hyperlipidemia, unspecified: Secondary | ICD-10-CM | POA: Diagnosis not present

## 2020-10-28 DIAGNOSIS — D125 Benign neoplasm of sigmoid colon: Secondary | ICD-10-CM | POA: Insufficient documentation

## 2020-10-28 DIAGNOSIS — K635 Polyp of colon: Secondary | ICD-10-CM | POA: Diagnosis not present

## 2020-10-28 DIAGNOSIS — Z7982 Long term (current) use of aspirin: Secondary | ICD-10-CM | POA: Insufficient documentation

## 2020-10-28 DIAGNOSIS — K5730 Diverticulosis of large intestine without perforation or abscess without bleeding: Secondary | ICD-10-CM | POA: Diagnosis not present

## 2020-10-28 DIAGNOSIS — E119 Type 2 diabetes mellitus without complications: Secondary | ICD-10-CM | POA: Diagnosis not present

## 2020-10-28 DIAGNOSIS — K5790 Diverticulosis of intestine, part unspecified, without perforation or abscess without bleeding: Secondary | ICD-10-CM | POA: Diagnosis not present

## 2020-10-28 DIAGNOSIS — I10 Essential (primary) hypertension: Secondary | ICD-10-CM | POA: Diagnosis not present

## 2020-10-28 DIAGNOSIS — Z1211 Encounter for screening for malignant neoplasm of colon: Secondary | ICD-10-CM | POA: Diagnosis not present

## 2020-10-28 DIAGNOSIS — K648 Other hemorrhoids: Secondary | ICD-10-CM | POA: Diagnosis not present

## 2020-10-28 DIAGNOSIS — Z87891 Personal history of nicotine dependence: Secondary | ICD-10-CM | POA: Diagnosis not present

## 2020-10-28 DIAGNOSIS — Z800 Family history of malignant neoplasm of digestive organs: Secondary | ICD-10-CM

## 2020-10-28 DIAGNOSIS — D123 Benign neoplasm of transverse colon: Secondary | ICD-10-CM | POA: Diagnosis not present

## 2020-10-28 DIAGNOSIS — Z96642 Presence of left artificial hip joint: Secondary | ICD-10-CM | POA: Diagnosis not present

## 2020-10-28 DIAGNOSIS — Z79899 Other long term (current) drug therapy: Secondary | ICD-10-CM | POA: Diagnosis not present

## 2020-10-28 HISTORY — PX: COLONOSCOPY WITH PROPOFOL: SHX5780

## 2020-10-28 HISTORY — DX: Type 2 diabetes mellitus without complications: E11.9

## 2020-10-28 LAB — GLUCOSE, CAPILLARY: Glucose-Capillary: 113 mg/dL — ABNORMAL HIGH (ref 70–99)

## 2020-10-28 SURGERY — COLONOSCOPY WITH PROPOFOL
Anesthesia: General

## 2020-10-28 MED ORDER — LIDOCAINE HCL (CARDIAC) PF 100 MG/5ML IV SOSY
PREFILLED_SYRINGE | INTRAVENOUS | Status: DC | PRN
  Administered 2020-10-28: 50 mg via INTRAVENOUS

## 2020-10-28 MED ORDER — PHENYLEPHRINE HCL (PRESSORS) 10 MG/ML IV SOLN
INTRAVENOUS | Status: AC
  Filled 2020-10-28: qty 1

## 2020-10-28 MED ORDER — GLYCOPYRROLATE 0.2 MG/ML IJ SOLN
INTRAMUSCULAR | Status: AC
  Filled 2020-10-28: qty 1

## 2020-10-28 MED ORDER — PROPOFOL 500 MG/50ML IV EMUL
INTRAVENOUS | Status: AC
  Filled 2020-10-28: qty 50

## 2020-10-28 MED ORDER — LIDOCAINE HCL (PF) 2 % IJ SOLN
INTRAMUSCULAR | Status: AC
  Filled 2020-10-28: qty 5

## 2020-10-28 MED ORDER — PROPOFOL 500 MG/50ML IV EMUL
INTRAVENOUS | Status: DC | PRN
  Administered 2020-10-28: 175 ug/kg/min via INTRAVENOUS

## 2020-10-28 MED ORDER — SODIUM CHLORIDE 0.9 % IV SOLN: INTRAVENOUS | Status: DC

## 2020-10-28 MED ORDER — PROPOFOL 10 MG/ML IV BOLUS
INTRAVENOUS | Status: DC | PRN
  Administered 2020-10-28: 80 mg via INTRAVENOUS

## 2020-10-28 NOTE — Transfer of Care (Signed)
Immediate Anesthesia Transfer of Care Note  Patient: David Turner  Procedure(s) Performed: COLONOSCOPY WITH PROPOFOL (N/A )  Patient Location: PACU  Anesthesia Type:General  Level of Consciousness: sedated  Airway & Oxygen Therapy: Patient Spontanous Breathing  Post-op Assessment: Report given to RN and Post -op Vital signs reviewed and stable  Post vital signs: Reviewed and stable  Last Vitals:  Vitals Value Taken Time  BP 88/59 10/28/20 0841  Temp    Pulse 59 10/28/20 0841  Resp 14 10/28/20 0841  SpO2 97 % 10/28/20 0841  Vitals shown include unvalidated device data.  Last Pain:  Vitals:   10/28/20 0731  TempSrc: Temporal  PainSc: 0-No pain         Complications: No complications documented.

## 2020-10-28 NOTE — Op Note (Signed)
[facility] Gastroenterology Patient Name: David Turner Procedure Date: 10/28/2020 8:05 AM MRN: [id number] Account #: [id number] Date of Birth: 02/12/1951 Admit Type: Outpatient Age: 70 Room: [facility] ENDO ROOM 2 Gender: Male Note Status: Finalized Procedure:             Colonoscopy Indications:           Screening for colorectal malignant neoplasm Providers:              B. Bonna Gains MD, MD Referring MD:          Pleas Koch (Referring MD) Medicines:             Monitored Anesthesia Care Complications:         No immediate complications. Procedure:             Pre-Anesthesia Assessment:                        - ASA Grade Assessment: II - A patient with mild                         systemic disease.                        - Prior to the procedure, a History and Physical was                         performed, and patient medications, allergies and                         sensitivities were reviewed. The patient's tolerance                         of previous anesthesia was reviewed.                        - The risks and benefits of the procedure and the                         sedation options and risks were discussed with the                         patient. All questions were answered and informed                         consent was obtained.                        - Patient identification and proposed procedure were                         verified prior to the procedure by the physician, the                         nurse, the anesthesiologist, the anesthetist and the                         technician. The procedure was verified in the                         procedure room.  After obtaining informed consent, the colonoscope was                         passed under direct vision. Throughout the procedure,                         the patient's blood pressure, pulse, and oxygen                         saturations were monitored  continuously. The                         Colonoscope was introduced through the anus and                         advanced to the the terminal ileum. The colonoscopy                         was performed with ease. The patient tolerated the                         procedure well. The quality of the bowel preparation                         was fair. Findings:      The perianal and digital rectal examinations were normal.      A 6 mm polyp was found in the transverse colon. The polyp was flat. The       polyp was removed with a cold snare. Resection and retrieval were       complete.      Two flat polyps were found in the sigmoid colon and transverse colon.       The polyps were 3 to 4 mm in size. These polyps were removed with a       jumbo cold forceps. Resection and retrieval were complete.      A few diverticula were found in the sigmoid colon.      The exam was otherwise without abnormality.      The rectum, sigmoid colon, descending colon, transverse colon, ascending       colon and cecum appeared normal.      Non-bleeding internal hemorrhoids were found during retroflexion. Impression:            - Preparation of the colon was fair.                        - One 6 mm polyp in the transverse colon, removed with                         a cold snare. Resected and retrieved.                        - Two 3 to 4 mm polyps in the sigmoid colon and in the                         transverse colon, removed with a jumbo cold forceps.  Resected and retrieved.                        - Diverticulosis in the sigmoid colon.                        - The examination was otherwise normal.                        - The rectum, sigmoid colon, descending colon,                         transverse colon, ascending colon and cecum are normal.                        - Non-bleeding internal hemorrhoids. Recommendation:        - Discharge patient to home (with escort).                         - Advance diet as tolerated.                        - Continue present medications.                        - Await pathology results.                        - Repeat colonoscopy in 1 year, with 2 day prep.                        - The findings and recommendations were discussed with                         the patient.                        - The findings and recommendations were discussed with                         the patient's family.                        - Return to primary care physician as previously                         scheduled.                        - High fiber diet. Procedure Code(s):     --- Professional ---                        [phone], Colonoscopy, flexible; with removal of                         tumor(s), polyp(s), or other lesion(s) by snare                         technique                        45380, 59, Colonoscopy, flexible;  with biopsy, single                         or multiple Diagnosis Code(s):     --- Professional ---                        K63.5, Polyp of colon                        Z12.11, Encounter for screening for malignant neoplasm                         of colon CPT copyright 2019 American Medical Association. All rights reserved. The codes documented in this report are preliminary and upon coder review may  be revised to meet current compliance requirements.  Vonda Antigua, MD Margretta Sidle B. Bonna Gains MD, MD 10/28/2020 10:27:21 AM This report has been signed electronically. Number of Addenda: 0 Note Initiated On: 10/28/2020 8:05 AM Scope Withdrawal Time: 0 hours 20 minutes 30 seconds  Total Procedure Duration: 0 hours 23 minutes 52 seconds  Estimated Blood Loss:  Estimated blood loss: none.      [facility]

## 2020-10-28 NOTE — Anesthesia Procedure Notes (Signed)
Date/Time: 10/28/2020 8:06 AM Performed by: Johnna Acosta, CRNA Pre-anesthesia Checklist: Patient identified, Suction available, Emergency Drugs available, Patient being monitored and Timeout performed Patient Re-evaluated:Patient Re-evaluated prior to induction Oxygen Delivery Method: Circle system utilized Preoxygenation: Pre-oxygenation with 100% oxygen Induction Type: IV induction

## 2020-10-28 NOTE — Anesthesia Postprocedure Evaluation (Signed)
Anesthesia Post Note  Patient: David Turner  Procedure(s) Performed: COLONOSCOPY WITH PROPOFOL (N/A )  Patient location during evaluation: PACU Anesthesia Type: General Level of consciousness: awake Pain management: pain level controlled Vital Signs Assessment: post-procedure vital signs reviewed and stable Respiratory status: spontaneous breathing Cardiovascular status: stable Postop Assessment: no apparent nausea or vomiting Anesthetic complications: no   No complications documented.   Last Vitals:  Vitals:   10/28/20 0900 10/28/20 0910  BP: 133/82 (!) 148/90  Pulse: 61 61  Resp: 15 18  Temp:    SpO2: 100% 100%    Last Pain:  Vitals:   10/28/20 0731  TempSrc: Temporal  PainSc: 0-No pain                 Neva Seat

## 2020-10-28 NOTE — Anesthesia Preprocedure Evaluation (Signed)
Anesthesia Evaluation  Patient identified by MRN, date of birth, ID band Patient awake    Reviewed: Allergy & Precautions, NPO status , Patient's Chart, lab work & pertinent test results  History of Anesthesia Complications Negative for: history of anesthetic complications  Airway Mallampati: II       Dental  (+) Missing, Chipped   Pulmonary neg sleep apnea, neg COPD, Not current smoker, former smoker,           Cardiovascular hypertension, Pt. on medications (-) Past MI and (-) CHF (-) dysrhythmias (-) Valvular Problems/Murmurs     Neuro/Psych neg Seizures    GI/Hepatic Neg liver ROS, neg GERD  ,  Endo/Other  diabetes, Type 2, Oral Hypoglycemic Agents  Renal/GU negative Renal ROS     Musculoskeletal   Abdominal   Peds  Hematology   Anesthesia Other Findings   Reproductive/Obstetrics                             Anesthesia Physical Anesthesia Plan  ASA: III  Anesthesia Plan: General   Post-op Pain Management:    Induction: Intravenous  PONV Risk Score and Plan: 2 and Ondansetron and Dexamethasone  Airway Management Planned: Nasal Cannula  Additional Equipment:   Intra-op Plan:   Post-operative Plan:   Informed Consent: I have reviewed the patients History and Physical, chart, labs and discussed the procedure including the risks, benefits and alternatives for the proposed anesthesia with the patient or authorized representative who has indicated his/her understanding and acceptance.       Plan Discussed with:   Anesthesia Plan Comments:         Anesthesia Quick Evaluation

## 2020-10-28 NOTE — H&P (Signed)
Vonda Antigua, MD [address], [place], [place], [place], [address] [address], [place], [place], [place], [address] Phone: [phone]  Fax: [phone]  Primary Care Physician:  Pleas Koch, NP   Pre-Procedure History & Physical: HPI:  David Turner is a 70 y.o. male is here for a colonoscopy.   Past Medical History:  Diagnosis Date  . Diabetes mellitus without complication ([place])   . Gout   . Hyperlipidemia   . Hypertension   . Wears dentures    partials, upper and lower    Past Surgical History:  Procedure Laterality Date  . CATARACT EXTRACTION W/PHACO Left 02/19/2019   Procedure: CATARACT EXTRACTION PHACO AND INTRAOCULAR LENS PLACEMENT ([place])  LEFT;  Surgeon: Leandrew Koyanagi, MD;  Location: [place] [facility];  Service: Ophthalmology;  Laterality: Left;  . left knee surgeries  1983 1993, 2008  . TOTAL HIP ARTHROPLASTY Left 05/19/2020    Prior to Admission medications   Medication Sig Start Date End Date Taking? Authorizing Provider  allopurinol (ZYLOPRIM) 300 MG tablet TAKE 1 TABLET(300 MG) BY MOUTH DAILY FOR GOUT PREVENTION 10/28/20  Yes Pleas Koch, NP  aspirin 81 MG tablet Take 81 mg by mouth daily.   Yes [provider]  atorvastatin (LIPITOR) 20 MG tablet TAKE 1 TABLET(20 MG) BY MOUTH EVERY EVENING FOR CHOLESTEROL 10/01/20  Yes Pleas Koch, NP  Garlic 6387 MG CAPS Take 2 capsules by mouth.   Yes [provider]  latanoprost (XALATAN) 0.005 % ophthalmic solution INT 1 GTT IN OU QD HS 06/13/16  Yes [provider]  metFORMIN (GLUCOPHAGE-XR) 500 MG 24 hr tablet Take 1 tablet (500 mg total) by mouth daily with breakfast. For diabetes. 10/08/20  Yes Pleas Koch, NP  Multiple Vitamin (MULTIVITAMIN) tablet Take 1 tablet by mouth daily.   Yes [provider]  Omega-3 Fatty Acids (FISH OIL) 1000 MG CAPS Take by mouth.   Yes [provider]  tadalafil (CIALIS) 20 MG tablet Take 1 tablet by  mouth 30 minutes prior to intercourse as needed. 10/07/20  Yes Pleas Koch, NP  valsartan-hydrochlorothiazide (DIOVAN HCT) 80-12.5 MG tablet Take 1 tablet by mouth daily. For blood pressure. 10/26/20  Yes Pleas Koch, NP    Allergies as of 10/08/2020  . (No Known Allergies)    Family History  Problem Relation Age of Onset  . Heart disease Father   . Cancer Sister 15       colon  . Heart disease Brother     Social History   Socioeconomic History  . Marital status: Married    Spouse name: Not on file  . Number of children: Not on file  . Years of education: Not on file  . Highest education level: Not on file  Occupational History  . Not on file  Tobacco Use  . Smoking status: Former Smoker    Quit date: 2002    Years since quitting: 20.1  . Smokeless tobacco: Never Used  . Tobacco comment: quit 2002  Vaping Use  . Vaping Use: Never used  Substance and Sexual Activity  . Alcohol use: Yes    Alcohol/week: 0.0 standard drinks    Comment: 6-8 beers  . Drug use: Never  . Sexual activity: Not on file  Other Topics Concern  . Not on file  Social History Narrative   Married.   Travels to and from [place] often- son still lives there.   Social Determinants of Health   Financial  Resource Strain: Not on file  Food Insecurity: Not on file  Transportation Needs: Not on file  Physical Activity: Not on file  Stress: Not on file  Social Connections: Not on file  Intimate Partner Violence: Not on file    Review of Systems: See HPI, otherwise negative ROS  Physical Exam: BP (!) 144/124   Pulse 71   Temp 98.1 F (36.7 C) (Temporal)   Resp 18   Ht 5' 9.5" (1.765 m)   Wt 85.3 kg   SpO2 100%   BMI 27.36 kg/m  General:   Alert,  pleasant and cooperative in NAD Head:  Normocephalic and atraumatic. Neck:  Supple; no masses or thyromegaly. Lungs:  Clear throughout to auscultation, normal respiratory effort.    Heart:  +S1, +S2, Regular rate and rhythm, No  edema. Abdomen:  Soft, nontender and nondistended. Normal bowel sounds, without guarding, and without rebound.   Neurologic:  Alert and  oriented x4;  grossly normal neurologically.  Impression/Plan: David Turner is here for a colonoscopy to be performed for family history of colon cancer (sister). Pt reports history of polyps. Last colonoscopy in 2013 at [facility] was normal. Prior to that reports having several colonoscopies (procedure report not available), and states polyps were removed on the very first one.   Risks, benefits, limitations, and alternatives regarding  colonoscopy have been reviewed with the patient.  Questions have been answered.  All parties agreeable.   Virgel Manifold, MD  10/28/2020, 8:04 AM

## 2020-10-29 ENCOUNTER — Encounter: Payer: Self-pay | Admitting: Gastroenterology

## 2020-10-29 LAB — SURGICAL PATHOLOGY

## 2020-11-03 ENCOUNTER — Encounter: Payer: Self-pay | Admitting: Gastroenterology

## 2020-11-05 ENCOUNTER — Encounter: Payer: Self-pay | Admitting: Primary Care

## 2020-12-31 DIAGNOSIS — Z23 Encounter for immunization: Secondary | ICD-10-CM | POA: Diagnosis not present

## 2021-01-04 ENCOUNTER — Other Ambulatory Visit: Payer: Self-pay | Admitting: Primary Care

## 2021-01-04 DIAGNOSIS — I10 Essential (primary) hypertension: Secondary | ICD-10-CM

## 2021-01-21 ENCOUNTER — Telehealth: Payer: Self-pay

## 2021-01-21 NOTE — Telephone Encounter (Signed)
[name] Key: [id number] - PA Case ID: [id number] - Rx #: [id number] Need help? Call us at [phone] PA Status for Patients Status Sent to [facility] Tadalafil 20MG  tablets Form [employer] Medicare Part D Electronic Prior Authorization Form (2017 NCPDP) Original Claim Info [id number] ADDINS:2;BN:[id number];PN:[place] ;[id number];TW:446;KM:; PA Required (855) [id number] If LOC chng, call [phone] Drug Requires Prior Authorization

## 2021-01-25 ENCOUNTER — Encounter: Payer: Self-pay | Admitting: Primary Care

## 2021-01-25 ENCOUNTER — Ambulatory Visit (INDEPENDENT_AMBULATORY_CARE_PROVIDER_SITE_OTHER): Payer: Medicare Other | Admitting: Primary Care

## 2021-01-25 ENCOUNTER — Other Ambulatory Visit: Payer: Self-pay

## 2021-01-25 VITALS — BP 138/72 | HR 73 | Temp 98.6°F | Ht 69.5 in | Wt 181.0 lb

## 2021-01-25 DIAGNOSIS — G8929 Other chronic pain: Secondary | ICD-10-CM

## 2021-01-25 DIAGNOSIS — M545 Low back pain, unspecified: Secondary | ICD-10-CM | POA: Diagnosis not present

## 2021-01-25 DIAGNOSIS — E119 Type 2 diabetes mellitus without complications: Secondary | ICD-10-CM

## 2021-01-25 DIAGNOSIS — M549 Dorsalgia, unspecified: Secondary | ICD-10-CM | POA: Insufficient documentation

## 2021-01-25 LAB — POCT GLYCOSYLATED HEMOGLOBIN (HGB A1C): Hemoglobin A1C: 5.6 % (ref 4.0–5.6)

## 2021-01-25 NOTE — Progress Notes (Signed)
Subjective:    Patient ID: David Turner, male    DOB: [birth year], 70 y.o.   MRN: [id number]  HPI  David Turner is a very pleasant 70 y.o. male with a history of type 2 diabetes, hypertension, hyperlipidemia gout who presents today for follow up of diabetes.  1) Type 2 Diabetes:  Current medications include: Metformin XR 500 mg.   Last A1C: 6.5 in January 2022, 5.6 today Last Eye Exam: UTD Last Foot Exam: Due Pneumonia Vaccination: UTD ACE/ARB: Valsartan  Statin: Lipitor   BP Readings from Last 3 Encounters:  01/25/21 138/72  10/28/20 (!) 148/90  10/26/20 130/68   2) Chronic Back Pain: Acute on chronic, located to the bilateral lower back which began a few months ago, also with acute on chronic left hip pain, and left shoulder pain. He would also like to see a chiropractor in the future.   He plans on scheduling an appointment with his orthopedist. He is due in September 2022 for post surgical follow up of the left hip.      Review of Systems  Eyes: Negative for visual disturbance.  Respiratory: Negative for shortness of breath.   Cardiovascular: Negative for chest pain.  Musculoskeletal: Positive for arthralgias.  Neurological: Negative for dizziness and headaches.         Past Medical History:  Diagnosis Date  . Diabetes mellitus without complication ([place])   . Gout   . Hyperlipidemia   . Hypertension   . Wears dentures    partials, upper and lower    Social History   Socioeconomic History  . Marital status: Married    Spouse name: Not on file  . Number of children: Not on file  . Years of education: Not on file  . Highest education level: Not on file  Occupational History  . Not on file  Tobacco Use  . Smoking status: Former Smoker    Quit date: 2002    Years since quitting: 20.3  . Smokeless tobacco: Never Used  . Tobacco comment: quit 2002  Vaping Use  . Vaping Use: Never used  Substance and Sexual Activity  . Alcohol use: Yes     Alcohol/week: 0.0 standard drinks    Comment: 6-8 beers  . Drug use: Never  . Sexual activity: Not on file  Other Topics Concern  . Not on file  Social History Narrative   Married.   Travels to and from [place] often- son still lives there.   Social Determinants of Health   Financial Resource Strain: Not on file  Food Insecurity: Not on file  Transportation Needs: Not on file  Physical Activity: Not on file  Stress: Not on file  Social Connections: Not on file  Intimate Partner Violence: Not on file    Past Surgical History:  Procedure Laterality Date  . CATARACT EXTRACTION W/PHACO Left 02/19/2019   Procedure: CATARACT EXTRACTION PHACO AND INTRAOCULAR LENS PLACEMENT ([place])  LEFT;  Surgeon: Leandrew Koyanagi, MD;  Location: [facility];  Service: Ophthalmology;  Laterality: Left;  . COLONOSCOPY WITH PROPOFOL N/A 10/28/2020   Procedure: COLONOSCOPY WITH PROPOFOL;  Surgeon: Virgel Manifold, MD;  Location: [facility] ENDOSCOPY;  Service: Endoscopy;  Laterality: N/A;  . left knee surgeries  1983 1993, 2008  . TOTAL HIP ARTHROPLASTY Left 05/19/2020    Family History  Problem Relation Age of Onset  . Heart disease Father   . Cancer Sister 55       colon  . Heart disease Brother  No Known Allergies  Current Outpatient Medications on File Prior to Visit  Medication Sig Dispense Refill  . allopurinol (ZYLOPRIM) 300 MG tablet TAKE 1 TABLET(300 MG) BY MOUTH DAILY FOR GOUT PREVENTION 90 tablet 3  . aspirin 81 MG tablet Take 81 mg by mouth daily.    Marland Kitchen atorvastatin (LIPITOR) 20 MG tablet TAKE 1 TABLET(20 MG) BY MOUTH EVERY EVENING FOR CHOLESTEROL 90 tablet 1  . Garlic 4193 MG CAPS Take 2 capsules by mouth.    . latanoprost (XALATAN) 0.005 % ophthalmic solution INT 1 GTT IN OU QD HS  3  . metFORMIN (GLUCOPHAGE-XR) 500 MG 24 hr tablet Take 1 tablet (500 mg total) by mouth daily with breakfast. For diabetes. 90 tablet 3  . Multiple Vitamin (MULTIVITAMIN) tablet Take 1 tablet  by mouth daily.    . Omega-3 Fatty Acids (FISH OIL) 1000 MG CAPS Take by mouth.    . tadalafil (CIALIS) 20 MG tablet Take 1 tablet by mouth 30 minutes prior to intercourse as needed. 30 tablet 0  . valsartan-hydrochlorothiazide (DIOVAN-HCT) 80-12.5 MG tablet TAKE 1 TABLET BY MOUTH DAILY FOR BLOOD PRESSURE 90 tablet 2   No current facility-administered medications on file prior to visit.    BP 138/72   Pulse 73   Temp 98.6 F (37 C) (Temporal)   Ht 5' 9.5" (1.765 m)   Wt 181 lb (82.1 kg)   SpO2 96%   BMI 26.35 kg/m  Objective:   Physical Exam Cardiovascular:     Rate and Rhythm: Normal rate and regular rhythm.  Pulmonary:     Effort: Pulmonary effort is normal.     Breath sounds: Normal breath sounds. No wheezing or rales.  Musculoskeletal:     Cervical back: Neck supple.  Skin:    General: Skin is warm and dry.  Neurological:     Mental Status: He is alert and oriented to person, place, and time.           Assessment & Plan:      This visit occurred during the SARS-CoV-2 public health emergency.  Safety protocols were in place, including screening questions prior to the visit, additional usage of staff PPE, and extensive cleaning of exam room while observing appropriate contact time as indicated for disinfecting solutions.

## 2021-01-25 NOTE — Assessment & Plan Note (Signed)
Significant improvement in A1C which is now 5.6. We decided to continue his Metformin XR 500 mg for now and recheck again in 3 months.  Managed on statin and ARB. Pneumonia vaccine uTD.  Lab appointment in 3 months. Follow up in 6 months.

## 2021-01-25 NOTE — Assessment & Plan Note (Signed)
Acute on chronic flare, improving today. He will notify if/when he's ready for a chiropractor referral. He requested this for the future.

## 2021-01-25 NOTE — Patient Instructions (Signed)
Continue taking metformin XR 500 mg once daily for diabetes.  Please schedule a lab only appointment in 3 months for diabetes check.  It was a pleasure to see you today!

## 2021-03-26 ENCOUNTER — Other Ambulatory Visit: Payer: Self-pay | Admitting: Primary Care

## 2021-03-26 DIAGNOSIS — E785 Hyperlipidemia, unspecified: Secondary | ICD-10-CM

## 2021-04-12 ENCOUNTER — Other Ambulatory Visit: Payer: Self-pay | Admitting: Primary Care

## 2021-04-12 DIAGNOSIS — E119 Type 2 diabetes mellitus without complications: Secondary | ICD-10-CM

## 2021-04-25 DIAGNOSIS — H40003 Preglaucoma, unspecified, bilateral: Secondary | ICD-10-CM | POA: Diagnosis not present

## 2021-04-27 ENCOUNTER — Other Ambulatory Visit: Payer: Self-pay

## 2021-04-27 ENCOUNTER — Other Ambulatory Visit (INDEPENDENT_AMBULATORY_CARE_PROVIDER_SITE_OTHER): Payer: Medicare Other

## 2021-04-27 DIAGNOSIS — E119 Type 2 diabetes mellitus without complications: Secondary | ICD-10-CM

## 2021-04-27 LAB — POCT GLYCOSYLATED HEMOGLOBIN (HGB A1C): Hemoglobin A1C: 5.9 % — AB (ref 4.0–5.6)

## 2021-05-20 DIAGNOSIS — Z96642 Presence of left artificial hip joint: Secondary | ICD-10-CM | POA: Diagnosis not present

## 2021-05-20 DIAGNOSIS — M25512 Pain in left shoulder: Secondary | ICD-10-CM | POA: Diagnosis not present

## 2021-07-01 DIAGNOSIS — M25512 Pain in left shoulder: Secondary | ICD-10-CM | POA: Diagnosis not present

## 2021-07-15 ENCOUNTER — Ambulatory Visit: Payer: TRICARE For Life (TFL)

## 2021-07-15 ENCOUNTER — Ambulatory Visit (INDEPENDENT_AMBULATORY_CARE_PROVIDER_SITE_OTHER): Payer: Medicare Other

## 2021-07-15 ENCOUNTER — Other Ambulatory Visit: Payer: Self-pay

## 2021-07-15 DIAGNOSIS — Z23 Encounter for immunization: Secondary | ICD-10-CM

## 2021-08-02 ENCOUNTER — Ambulatory Visit
Admission: EM | Admit: 2021-08-02 | Discharge: 2021-08-02 | Disposition: A | Discharge status: Home / Self Care | Payer: Medicare Other | Attending: Family Medicine | Admitting: Family Medicine

## 2021-08-02 ENCOUNTER — Encounter: Payer: Self-pay | Admitting: Emergency Medicine

## 2021-08-02 ENCOUNTER — Ambulatory Visit (INDEPENDENT_AMBULATORY_CARE_PROVIDER_SITE_OTHER): Payer: Medicare Other

## 2021-08-02 DIAGNOSIS — Z20822 Contact with and (suspected) exposure to covid-19: Secondary | ICD-10-CM | POA: Diagnosis not present

## 2021-08-02 DIAGNOSIS — R059 Cough, unspecified: Secondary | ICD-10-CM | POA: Diagnosis not present

## 2021-08-02 DIAGNOSIS — R0602 Shortness of breath: Secondary | ICD-10-CM

## 2021-08-02 DIAGNOSIS — J22 Unspecified acute lower respiratory infection: Secondary | ICD-10-CM

## 2021-08-02 MED ORDER — ALBUTEROL SULFATE HFA 108 (90 BASE) MCG/ACT IN AERS
2.0000 | INHALATION_SPRAY | Freq: Once | RESPIRATORY_TRACT | Status: AC
  Administered 2021-08-02: 2 via RESPIRATORY_TRACT

## 2021-08-02 MED ORDER — PREDNISONE 20 MG PO TABS: 40.0000 mg | ORAL_TABLET | Freq: Every day | ORAL | 0 refills | Status: DC

## 2021-08-02 MED ORDER — BENZONATATE 100 MG PO CAPS: 200.0000 mg | ORAL_CAPSULE | Freq: Three times a day (TID) | ORAL | 0 refills | Status: DC | PRN

## 2021-08-02 MED ORDER — CEFTRIAXONE SODIUM 500 MG IJ SOLR
500.0000 mg | Freq: Once | INTRAMUSCULAR | Status: AC
  Administered 2021-08-02: 500 mg via INTRAMUSCULAR

## 2021-08-02 MED ORDER — CEFDINIR 300 MG PO CAPS: 300.0000 mg | ORAL_CAPSULE | Freq: Two times a day (BID) | ORAL | 0 refills | Status: AC

## 2021-08-02 MED ORDER — METHYLPREDNISOLONE SODIUM SUCC 125 MG IJ SOLR
125.0000 mg | Freq: Once | INTRAMUSCULAR | Status: AC
  Administered 2021-08-02: 125 mg via INTRAMUSCULAR

## 2021-08-02 NOTE — ED Triage Notes (Signed)
Pt c/o cough and chest congestion x 2 weeks

## 2021-08-02 NOTE — Discharge Instructions (Addendum)
Start your steroids tomorrow she received a dose of steroids here at the urgent care. Start your first dose of antibiotics after you are able to eat dinner or put a light snack on your stomach this evening. You may take the cough suppressant tablets every 8 hours as needed. Albuterol 2 puffs every 4-6 hours as needed for shortness of breath, wheezing, or chest tightness. If any of your symptoms worsen go immediately to the emergency department or you develop any difficulty breathing follow-up at the ER.   Your COVID/FLU/RSV results should result within 3-5 days. Negative results are immediately resulted to Mychart. Positive results will receive a follow-up call from our clinic. If symptoms are present, I recommend home quarantine until results are known.  Alternate Tylenol and ibuprofen as needed for body aches and fever.  Symptom management per recommendations discussed today.  If any breathing difficulty or chest pain develops go immediately to the closest emergency department for evaluation.

## 2021-08-02 NOTE — ED Provider Notes (Signed)
Roderic Palau    CSN: [id number] Arrival date & time: 08/02/21  1155      History   Chief Complaint Chief Complaint  Patient presents with   Cough    HPI Jaxx Huish is a 70 y.o. male.   HPI Patient presents today accompanied by spouse with 2 weeks of progressively worsening shortness of breath, cough which is occasionally productive, occasional wheezing, fatigue, generalized body aches, and poor appetite, and fever.  Patient endorses that her grand children and children who live in the same household were sick with a similar course of illness approximately 2 weeks ago however they recovered and did not test positive for COVID or flu.  His symptoms have progressively worsened.  He has a history of hypertension and type 2 diabetes.  He has been attempting to manage symptoms with over-the-counter medication without any improvement of symptoms.  Reports fever has been in the lower 100s.  On arrival today has a low-grade temperature of 99.4.  Past Medical History:  Diagnosis Date   Diabetes mellitus without complication ([place])    Gout    Hyperlipidemia    Hypertension    Wears dentures    partials, upper and lower    Patient Active Problem List   Diagnosis Date Noted   Chronic back pain 01/25/2021   Polyp of transverse colon    Polyp of sigmoid colon    Type 2 diabetes mellitus ([place]) 10/26/2020   Family history of colon cancer 10/07/2020   Cervical radiculopathy 04/11/2018   ED (erectile dysfunction) 11/26/2017   Allergic rhinitis 11/26/2017   Gout    Hypertension    Hyperlipidemia     Past Surgical History:  Procedure Laterality Date   CATARACT EXTRACTION W/PHACO Left 02/19/2019   Procedure: CATARACT EXTRACTION PHACO AND INTRAOCULAR LENS PLACEMENT ([place])  LEFT;  Surgeon: Leandrew Koyanagi, MD;  Location: [facility];  Service: Ophthalmology;  Laterality: Left;   COLONOSCOPY WITH PROPOFOL N/A 10/28/2020   Procedure: COLONOSCOPY WITH PROPOFOL;  Surgeon:  Virgel Manifold, MD;  Location: [facility];  Service: Endoscopy;  Laterality: N/A;   left knee surgeries  1983 1993, 2008   TOTAL HIP ARTHROPLASTY Left 05/19/2020       Home Medications    Prior to Admission medications   Medication Sig Start Date End Date Taking? Authorizing Provider  allopurinol (ZYLOPRIM) 300 MG tablet TAKE 1 TABLET(300 MG) BY MOUTH DAILY FOR GOUT PREVENTION 10/28/20  Yes Pleas Koch, NP  aspirin 81 MG tablet Take 81 mg by mouth daily.   Yes [provider]  atorvastatin (LIPITOR) 20 MG tablet TAKE 1 TABLET(20 MG) BY MOUTH EVERY EVENING FOR CHOLESTEROL 03/28/21  Yes Pleas Koch, NP  benzonatate (TESSALON) 100 MG capsule Take 2 capsules (200 mg total) by mouth 3 (three) times daily as needed for cough. 08/02/21  Yes Scot Jun, FNP  cefdinir (OMNICEF) 300 MG capsule Take 1 capsule (300 mg total) by mouth 2 (two) times daily for 10 days. 08/02/21 08/12/21 Yes Scot Jun, FNP  Garlic 5102 MG CAPS Take 2 capsules by mouth.   Yes [provider]  latanoprost (XALATAN) 0.005 % ophthalmic solution INT 1 GTT IN OU QD HS 06/13/16  Yes [provider]  metFORMIN (GLUCOPHAGE-XR) 500 MG 24 hr tablet Take 1 tablet (500 mg total) by mouth daily with breakfast. For diabetes. 10/08/20  Yes Pleas Koch, NP  Multiple Vitamin (MULTIVITAMIN) tablet Take 1 tablet by mouth daily.   Yes  [provider]  Omega-3 Fatty Acids (FISH OIL) 1000 MG CAPS Take by mouth.   Yes [provider]  predniSONE (DELTASONE) 20 MG tablet Take 2 tablets (40 mg total) by mouth daily with breakfast. 08/03/21  Yes Scot Jun, FNP  valsartan-hydrochlorothiazide (DIOVAN-HCT) 80-12.5 MG tablet TAKE 1 TABLET BY MOUTH DAILY FOR BLOOD PRESSURE 01/04/21  Yes Pleas Koch, NP  tadalafil (CIALIS) 20 MG tablet Take 1 tablet by mouth 30 minutes prior to intercourse as needed. 10/07/20   Pleas Koch, NP    Family  History Family History  Problem Relation Age of Onset   Heart disease Father    Cancer Sister 38       colon   Heart disease Brother     Social History Social History   Tobacco Use   Smoking status: Former    Types: Cigarettes    Quit date: 2002    Years since quitting: 20.9   Smokeless tobacco: Never   Tobacco comments:    quit 2002  Vaping Use   Vaping Use: Never used  Substance Use Topics   Alcohol use: Yes    Alcohol/week: 0.0 standard drinks    Comment: 6-8 beers   Drug use: Never     Allergies   Patient has no known allergies.   Review of Systems Review of Systems Pertinent negatives listed in HPI   Physical Exam Triage Vital Signs ED Triage Vitals [08/02/21 1238]  Enc Vitals Group     BP (!) 163/83     Pulse Rate 84     Resp 20     Temp 99.4 F (37.4 C)     Temp Source Oral     SpO2      Weight      Height      Head Circumference      Peak Flow      Pain Score      Pain Loc      Pain Edu?      Excl. in [place]?    No data found.  Updated Vital Signs BP (!) 163/83 (BP Location: Left Arm)   Pulse 84   Temp 99.4 F (37.4 C) (Oral)   Resp 20   SpO2 not documented in system 92%-94%   Visual Acuity Right Eye Distance:   Left Eye Distance:   Bilateral Distance:    Right Eye Near:   Left Eye Near:    Bilateral Near:     Physical Exam Constitutional:      Appearance: He is obese. He is ill-appearing.  HENT:     Head: Normocephalic.     Right Ear: Tympanic membrane normal.     Left Ear: Tympanic membrane normal.     Nose: Congestion and rhinorrhea present.  Eyes:     Extraocular Movements: Extraocular movements intact.     Pupils: Pupils are equal, round, and reactive to light.  Cardiovascular:     Rate and Rhythm: Normal rate and regular rhythm.  Pulmonary:     Effort: Tachypnea present.     Breath sounds: Decreased air movement present. Rhonchi present.     Comments: Expiratory wheeze present on exam Abdominal:     General:  Abdomen is protuberant.     Tenderness: There is no abdominal tenderness.  Musculoskeletal:     Cervical back: No rigidity or tenderness.  Lymphadenopathy:     Cervical: Cervical adenopathy present.  Neurological:     General: No focal deficit present.  GCS: GCS eye subscore is 4. GCS verbal subscore is 5. GCS motor subscore is 6.  Psychiatric:        Attention and Perception: Attention normal.        Mood and Affect: Mood normal.        Speech: Speech normal.        Behavior: Behavior normal.     UC Treatments / Results  Labs (all labs ordered are listed, but only abnormal results are displayed) Labs Reviewed  COVID-19, FLU A+B AND RSV   Narrative:    Performed at:  [facility] [address] [place], [place]  [id number] Lab Director: Rush Farmer MD, Phone:  [phone]    EKG   Radiology No results found.  Procedures Procedures (including critical care time)  Medications Ordered in UC Medications  methylPREDNISolone sodium succinate (SOLU-MEDROL) 125 mg/2 mL injection 125 mg (125 mg Intramuscular Given 08/02/21 1308)  albuterol (VENTOLIN HFA) 108 (90 Base) MCG/ACT inhaler 2 puff (2 puffs Inhalation Given 08/02/21 1308)  cefTRIAXone (ROCEPHIN) injection 500 mg (500 mg Intramuscular Given 08/02/21 1308)    Initial Impression / Assessment and Plan / UC Course  I have reviewed the triage vital signs and the nursing notes.  Pertinent labs & imaging results that were available during my care of the patient were reviewed by me and considered in my medical decision making (see chart for details).    Suspected COVID-19 virus, respiratory panel pending Chest x-ray is concerning for possible pneumonia treating for coverage of pneumonia with cefdinir, prednisone and benzonatate [place].  Strict ER precautions given if any of his symptoms do not readily improve to follow-up immediately at the emergency department for further evaluation.  If he develops any  severe difficulty breathing or chest heaviness or chest tightness go immediately to the ER.  Patient verbalized understanding and agreement with plan today. Final Clinical Impressions(s) / UC Diagnoses   Final diagnoses:  Suspected COVID-19 virus infection  Lower respiratory infection (e.g., bronchitis, pneumonia, pneumonitis, pulmonitis)     Discharge Instructions      Start your steroids tomorrow she received a dose of steroids here at the urgent care. Start your first dose of antibiotics after you are able to eat dinner or put a light snack on your stomach this evening. You may take the cough suppressant tablets every 8 hours as needed. Albuterol 2 puffs every 4-6 hours as needed for shortness of breath, wheezing, or chest tightness. If any of your symptoms worsen go immediately to the emergency department or you develop any difficulty breathing follow-up at the ER.   Your COVID/FLU/RSV results should result within 3-5 days. Negative results are immediately resulted to Mychart. Positive results will receive a follow-up call from our clinic. If symptoms are present, I recommend home quarantine until results are known.  Alternate Tylenol and ibuprofen as needed for body aches and fever.  Symptom management per recommendations discussed today.  If any breathing difficulty or chest pain develops go immediately to the closest emergency department for evaluation.      ED Prescriptions     Medication Sig Dispense Auth. Provider   cefdinir (OMNICEF) 300 MG capsule Take 1 capsule (300 mg total) by mouth 2 (two) times daily for 10 days. 20 capsule Scot Jun, FNP   predniSONE (DELTASONE) 20 MG tablet Take 2 tablets (40 mg total) by mouth daily with breakfast. 10 tablet Scot Jun, FNP   benzonatate (TESSALON) 100 MG capsule Take 2 capsules (200  mg total) by mouth 3 (three) times daily as needed for cough. 40 capsule Scot Jun, FNP      PDMP not reviewed this  encounter.   Scot Jun, [place] 08/07/21 [phone]

## 2021-08-03 ENCOUNTER — Telehealth: Payer: TRICARE For Life (TFL) | Admitting: Nurse Practitioner

## 2021-08-04 LAB — COVID-19, FLU A+B AND RSV
Influenza A, NAA: NOT DETECTED
Influenza B, NAA: NOT DETECTED
RSV, NAA: NOT DETECTED
SARS-CoV-2, NAA: NOT DETECTED

## 2021-09-13 ENCOUNTER — Other Ambulatory Visit: Payer: Self-pay

## 2021-09-13 DIAGNOSIS — Z1211 Encounter for screening for malignant neoplasm of colon: Secondary | ICD-10-CM

## 2021-09-13 MED ORDER — CLENPIQ 10-3.5-12 MG-GM -GM/160ML PO SOLN: 1.0000 | ORAL | 0 refills | Status: DC

## 2021-09-13 NOTE — Progress Notes (Signed)
Gastroenterology Pre-Procedure Review  Request Date: 11/04/2021 Requesting Physician: Dr. Vicente Males  PATIENT REVIEW QUESTIONS: The patient responded to the following health history questions as indicated:    1. Are you having any GI issues? no 2. Do you have a personal history of Polyps? no 3. Do you have a family history of Colon Cancer or Polyps? no 4. Diabetes Mellitus? yes 5. Joint replacements in the past 12 months?no 6. Major health problems in the past 3 months?no 7. Any artificial heart valves, MVP, or defibrillator?no    MEDICATIONS & ALLERGIES:    Patient reports the following regarding taking any anticoagulation/antiplatelet therapy:   Plavix, Coumadin, Eliquis, Xarelto, Lovenox, Pradaxa, Brilinta, or Effient? no Aspirin? no  Patient confirms/reports the following medications:  Current Outpatient Medications  Medication Sig Dispense Refill   allopurinol (ZYLOPRIM) 300 MG tablet TAKE 1 TABLET(300 MG) BY MOUTH DAILY FOR GOUT PREVENTION 90 tablet 3   aspirin 81 MG tablet Take 81 mg by mouth daily.     atorvastatin (LIPITOR) 20 MG tablet TAKE 1 TABLET(20 MG) BY MOUTH EVERY EVENING FOR CHOLESTEROL 90 tablet 1   benzonatate (TESSALON) 100 MG capsule Take 2 capsules (200 mg total) by mouth 3 (three) times daily as needed for cough. 40 capsule 0   Garlic 7943 MG CAPS Take 2 capsules by mouth.     latanoprost (XALATAN) 0.005 % ophthalmic solution INT 1 GTT IN OU QD HS  3   metFORMIN (GLUCOPHAGE-XR) 500 MG 24 hr tablet Take 1 tablet (500 mg total) by mouth daily with breakfast. For diabetes. 90 tablet 3   Multiple Vitamin (MULTIVITAMIN) tablet Take 1 tablet by mouth daily.     Omega-3 Fatty Acids (FISH OIL) 1000 MG CAPS Take by mouth.     predniSONE (DELTASONE) 20 MG tablet Take 2 tablets (40 mg total) by mouth daily with breakfast. 10 tablet 0   Sod Picosulfate-Mag Ox-Cit Acd (CLENPIQ) 10-3.5-12 MG-GM -GM/160ML SOLN Take 1 kit by mouth as directed. At 5 PM evening before procedure,  drink 1 bottle of Clenpiq, hydrate, drink (5) 8 oz of water. Then do the same thing 5 hours prior to your procedure. 320 mL 0   tadalafil (CIALIS) 20 MG tablet Take 1 tablet by mouth 30 minutes prior to intercourse as needed. 30 tablet 0   valsartan-hydrochlorothiazide (DIOVAN-HCT) 80-12.5 MG tablet TAKE 1 TABLET BY MOUTH DAILY FOR BLOOD PRESSURE 90 tablet 2   No current facility-administered medications for this visit.    Patient confirms/reports the following allergies:  No Known Allergies  No orders of the defined types were placed in this encounter.   AUTHORIZATION INFORMATION Primary Insurance: 1D#: Group #:  Secondary Insurance: 1D#: Group #:  SCHEDULE INFORMATION: Date: 11/04/2021 Time: Location:[facility]

## 2021-09-19 ENCOUNTER — Other Ambulatory Visit: Payer: Self-pay | Admitting: Primary Care

## 2021-09-19 DIAGNOSIS — E785 Hyperlipidemia, unspecified: Secondary | ICD-10-CM

## 2021-09-19 NOTE — Telephone Encounter (Signed)
Message sent to reception to call for follow up. Should have been seen for follow up 11/22. Per last office visit.

## 2021-09-19 NOTE — Telephone Encounter (Signed)
Please call patient due for follow up

## 2021-09-19 NOTE — Telephone Encounter (Signed)
LMTCB to schedule follow up.

## 2021-09-21 ENCOUNTER — Other Ambulatory Visit: Payer: Self-pay | Admitting: Primary Care

## 2021-09-21 DIAGNOSIS — E785 Hyperlipidemia, unspecified: Secondary | ICD-10-CM

## 2021-09-25 ENCOUNTER — Other Ambulatory Visit: Payer: Self-pay | Admitting: Primary Care

## 2021-09-25 DIAGNOSIS — I10 Essential (primary) hypertension: Secondary | ICD-10-CM

## 2021-09-25 DIAGNOSIS — E119 Type 2 diabetes mellitus without complications: Secondary | ICD-10-CM

## 2021-09-26 ENCOUNTER — Other Ambulatory Visit: Payer: Self-pay | Admitting: Primary Care

## 2021-09-26 DIAGNOSIS — I10 Essential (primary) hypertension: Secondary | ICD-10-CM

## 2021-09-26 DIAGNOSIS — E119 Type 2 diabetes mellitus without complications: Secondary | ICD-10-CM

## 2021-09-29 ENCOUNTER — Ambulatory Visit (INDEPENDENT_AMBULATORY_CARE_PROVIDER_SITE_OTHER): Payer: Medicare Other | Admitting: Primary Care

## 2021-09-29 ENCOUNTER — Other Ambulatory Visit: Payer: Self-pay

## 2021-09-29 ENCOUNTER — Other Ambulatory Visit: Payer: Self-pay | Admitting: Primary Care

## 2021-09-29 VITALS — BP 158/80 | HR 68 | Temp 98.0°F | Ht 69.0 in | Wt 185.1 lb

## 2021-09-29 DIAGNOSIS — Z125 Encounter for screening for malignant neoplasm of prostate: Secondary | ICD-10-CM

## 2021-09-29 DIAGNOSIS — I10 Essential (primary) hypertension: Secondary | ICD-10-CM | POA: Diagnosis not present

## 2021-09-29 DIAGNOSIS — M109 Gout, unspecified: Secondary | ICD-10-CM | POA: Diagnosis not present

## 2021-09-29 DIAGNOSIS — H938X3 Other specified disorders of ear, bilateral: Secondary | ICD-10-CM | POA: Diagnosis not present

## 2021-09-29 DIAGNOSIS — N529 Male erectile dysfunction, unspecified: Secondary | ICD-10-CM | POA: Diagnosis not present

## 2021-09-29 DIAGNOSIS — E785 Hyperlipidemia, unspecified: Secondary | ICD-10-CM | POA: Diagnosis not present

## 2021-09-29 DIAGNOSIS — K635 Polyp of colon: Secondary | ICD-10-CM | POA: Diagnosis not present

## 2021-09-29 DIAGNOSIS — E1165 Type 2 diabetes mellitus with hyperglycemia: Secondary | ICD-10-CM

## 2021-09-29 HISTORY — DX: Other specified disorders of ear, bilateral: H93.8X3

## 2021-09-29 LAB — LIPID PANEL
Cholesterol: 147 mg/dL (ref 0–200)
HDL: 66.8 mg/dL (ref >39.00)
LDL Cholesterol: 70 mg/dL (ref 0–99)
NonHDL: 80.33
Total CHOL/HDL Ratio: 2
Triglycerides: 54 mg/dL (ref 0.0–149.0)
VLDL: 10.8 mg/dL (ref 0.0–40.0)

## 2021-09-29 LAB — COMPREHENSIVE METABOLIC PANEL
ALT: 30 U/L (ref 0–53)
AST: 28 U/L (ref 0–37)
Albumin: 4.4 g/dL (ref 3.5–5.2)
Alkaline Phosphatase: 96 U/L (ref 39–117)
BUN: 10 mg/dL (ref 6–23)
CO2: 31 mEq/L (ref 19–32)
Calcium: 9.6 mg/dL (ref 8.4–10.5)
Chloride: 101 mEq/L (ref 96–112)
Creatinine, Ser: 0.81 mg/dL (ref 0.40–1.50)
GFR: 89.4 mL/min (ref >60.00)
Glucose, Bld: 113 mg/dL — ABNORMAL HIGH (ref 70–99)
Potassium: 4.6 mEq/L (ref 3.5–5.1)
Sodium: 140 mEq/L (ref 135–145)
Total Bilirubin: 0.7 mg/dL (ref 0.2–1.2)
Total Protein: 7 g/dL (ref 6.0–8.3)

## 2021-09-29 LAB — PSA, MEDICARE: PSA: 5.39 ng/ml — ABNORMAL HIGH (ref 0.10–4.00)

## 2021-09-29 LAB — HEMOGLOBIN A1C: Hgb A1c MFr Bld: 6.4 % (ref 4.6–6.5)

## 2021-09-29 LAB — URIC ACID: Uric Acid, Serum: 5 mg/dL (ref 4.0–7.8)

## 2021-09-29 MED ORDER — VALSARTAN-HYDROCHLOROTHIAZIDE 160-12.5 MG PO TABS: 1.0000 | ORAL_TABLET | Freq: Every day | ORAL | 0 refills | Status: DC

## 2021-09-29 NOTE — Assessment & Plan Note (Signed)
Infrequent use of tadalafil 20 mg. Continue tadalafil 20 mg as needed.

## 2021-09-29 NOTE — Patient Instructions (Addendum)
We increased your blood pressure medication to valsartan-hydrochlorothiazide 160-12.5 mg. Take this once daily.  Stop by the lab prior to leaving today. I will notify you of your results once received.   Ear Pressure/Fullness: Try using Flonase (fluticasone) nasal spray. Instill 1 spray in each nostril twice daily.   Ear itching: Try some hydrocortisone cream twice daily to the ear canals.   Schedule a follow up visit with me for 3 weeks for blood pressure check.  It was a pleasure to see you today!

## 2021-09-29 NOTE — Progress Notes (Signed)
Subjective:    Patient ID: David Turner, male    DOB: Jun 29, 1951, 71 y.o.   MRN: [id number]  HPI  David Turner is a very pleasant 71 y.o. male with a history of hypertension, allergic rhinitis, type 2 diabetes, hyperlipidemia, gout, chronic back pain, ED who presents today for follow up of chronic conditions and to discuss ear pain.  Immunizations: -Tetanus: 2019 -Influenza: Completed this season  -Covid-19: 3 vaccines -Shingles: Completed Shingrix and Zostavax -Pneumonia: Prevnar 13 in 2019, Pneumovax in 2020  Colonoscopy: Completed in February 2022, due February 2023 PSA: Due   1) Essential Hypertension: Currently managed on valsartan-HCTZ 80-12.5 mg.  He has checked his BP at home a few times "recently" which were 154/78 and 160/80. He is compliant to his valsartan-HCTZ 80-12.5 mg.  He denies blurred vision, chest pain, headaches, chest pain.   BP Readings from Last 3 Encounters:  09/29/21 (!) 158/80  08/02/21 (!) 163/83  01/25/21 138/72     2) Chronic Gout: Currently managed on allopurinol 300 mg daily. Uric acid level due today. He denies gout flares in years.   3) Type 2 Diabetes:  Current medications include: Metformin XR 500 mg daily.  Last A1C: 5.9 in August 2022, due today Last Eye Exam: UTD Last Foot Exam: Due Pneumonia Vaccination: 2020 Urine Microalbumin: None. Managed on ARB Statin: atorvastatin   Dietary changes since last visit: Home cooked meals, no fried food.    Exercise: No regular exercise, active outside.   4) Erectile Dysfunction: Currently managed on tadalafil 20 mg daily PRN. No recent use. Would like to keep as needed.   5) Ear Pressure/Itching: To bilateral ears for the last 6 months. He denies pain. He's not tried anything OTC.    Review of Systems  HENT:  Negative for congestion and ear pain.   Eyes:  Negative for visual disturbance.  Respiratory:  Negative for shortness of breath.   Cardiovascular:  Negative for chest pain.   Gastrointestinal:  Negative for constipation and diarrhea.  Neurological:  Negative for dizziness and headaches.        Past Medical History:  Diagnosis Date   Diabetes mellitus without complication ([facility])    Gout    Hyperlipidemia    Hypertension    Wears dentures    partials, upper and lower    Social History   Socioeconomic History   Marital status: Married    Spouse name: Not on file   Number of children: Not on file   Years of education: Not on file   Highest education level: Not on file  Occupational History   Not on file  Tobacco Use   Smoking status: Former    Types: Cigarettes    Quit date: 2002    Years since quitting: 21.0   Smokeless tobacco: Never   Tobacco comments:    quit 2002  Vaping Use   Vaping Use: Never used  Substance and Sexual Activity   Alcohol use: Yes    Alcohol/week: 0.0 standard drinks    Comment: 6-8 beers   Drug use: Never   Sexual activity: Not on file  Other Topics Concern   Not on file  Social History Narrative   Married.   Travels to and from [place] often- son still lives there.   Social Determinants of Health   Financial Resource Strain: Not on file  Food Insecurity: Not on file  Transportation Needs: Not on file  Physical Activity: Not on file  Stress: Not on file  Social Connections: Not on file  Intimate Partner Violence: Not on file    Past Surgical History:  Procedure Laterality Date   CATARACT EXTRACTION W/PHACO Left 02/19/2019   Procedure: CATARACT EXTRACTION PHACO AND INTRAOCULAR LENS PLACEMENT ([place])  LEFT;  Surgeon: Leandrew Koyanagi, MD;  Location: [facility];  Service: Ophthalmology;  Laterality: Left;   COLONOSCOPY WITH PROPOFOL N/A 10/28/2020   Procedure: COLONOSCOPY WITH PROPOFOL;  Surgeon: Virgel Manifold, MD;  Location: [facility];  Service: Endoscopy;  Laterality: N/A;   left knee surgeries  1983 1993, 2008   TOTAL HIP ARTHROPLASTY Left 05/19/2020    Family History  Problem  Relation Age of Onset   Heart disease Father    Cancer Sister 61       colon   Heart disease Brother     No Known Allergies  Current Outpatient Medications on File Prior to Visit  Medication Sig Dispense Refill   allopurinol (ZYLOPRIM) 300 MG tablet TAKE 1 TABLET(300 MG) BY MOUTH DAILY FOR GOUT PREVENTION 90 tablet 3   amoxicillin (AMOXIL) 500 MG tablet Take 2,000 mg by mouth as directed. Prior to dental procedures     aspirin 81 MG tablet Take 81 mg by mouth daily.     atorvastatin (LIPITOR) 20 MG tablet Take 1 tablet (20 mg total) by mouth daily. For cholesterol. Office visit required for further refills. 30 tablet 0   Garlic 2297 MG CAPS Take 2 capsules by mouth.     latanoprost (XALATAN) 0.005 % ophthalmic solution INT 1 GTT IN OU QD HS  3   metFORMIN (GLUCOPHAGE-XR) 500 MG 24 hr tablet Take 1 tablet (500 mg total) by mouth daily with breakfast. For diabetes. Office visit required for further refills. 90 tablet 0   Multiple Vitamin (MULTIVITAMIN) tablet Take 1 tablet by mouth daily.     Omega-3 Fatty Acids (FISH OIL) 1000 MG CAPS Take by mouth.     Sod Picosulfate-Mag Ox-Cit Acd (CLENPIQ) 10-3.5-12 MG-GM -GM/160ML SOLN Take 1 kit by mouth as directed. At 5 PM evening before procedure, drink 1 bottle of Clenpiq, hydrate, drink (5) 8 oz of water. Then do the same thing 5 hours prior to your procedure. 320 mL 0   tadalafil (CIALIS) 20 MG tablet Take 1 tablet by mouth 30 minutes prior to intercourse as needed. 30 tablet 0   No current facility-administered medications on file prior to visit.    BP (!) 158/80    Pulse 68    Temp 98 F (36.7 C) (Temporal)    Ht $R'5\' 9"'SZ$  (1.753 m)    Wt 185 lb 1 oz (83.9 kg)    SpO2 98%    BMI 27.33 kg/m  Objective:   Physical Exam HENT:     Right Ear: Tympanic membrane and ear canal normal. Tympanic membrane is not erythematous.     Left Ear: Tympanic membrane and ear canal normal. Tympanic membrane is not erythematous.     Ears:     Comments: Mild  bulging bilaterally to canals Cardiovascular:     Rate and Rhythm: Normal rate and regular rhythm.  Pulmonary:     Effort: Pulmonary effort is normal.     Breath sounds: Normal breath sounds. No wheezing or rales.  Abdominal:     General: Bowel sounds are normal.     Palpations: Abdomen is soft.     Tenderness: There is no abdominal tenderness.  Musculoskeletal:     Cervical back: Neck supple.  Skin:  General: Skin is warm and dry.  Neurological:     Mental Status: He is alert and oriented to person, place, and time.          Assessment & Plan:      This visit occurred during the SARS-CoV-2 public health emergency.  Safety protocols were in place, including screening questions prior to the visit, additional usage of staff PPE, and extensive cleaning of exam room while observing appropriate contact time as indicated for disinfecting solutions.

## 2021-09-29 NOTE — Assessment & Plan Note (Signed)
No abnormality on exam.  Discussed trial of Flonase BID. He will update.

## 2021-09-29 NOTE — Assessment & Plan Note (Signed)
Continue atorvastatin 20 mg. Repeat lipid panel pending.

## 2021-09-29 NOTE — Assessment & Plan Note (Signed)
Scheduled for repeat colonoscopy for next month.

## 2021-09-29 NOTE — Assessment & Plan Note (Signed)
Continue Metformin XR 500 mg daily. A1C pending.  Eye exam UTD. Managed on ARB and statin.  Follow up in 6 months.

## 2021-09-29 NOTE — Assessment & Plan Note (Signed)
Above goal today, also during prior visit and with home readings.  Increase valsartan-HCTZ to 160-12.5 mg. New Rx sent to pharmacy.   CMP pending. We will see him back in 3 weeks for BP check.

## 2021-10-11 ENCOUNTER — Other Ambulatory Visit: Payer: Self-pay | Admitting: Primary Care

## 2021-10-11 DIAGNOSIS — M109 Gout, unspecified: Secondary | ICD-10-CM

## 2021-10-25 DIAGNOSIS — H40003 Preglaucoma, unspecified, bilateral: Secondary | ICD-10-CM | POA: Diagnosis not present

## 2021-10-25 LAB — HM DIABETES EYE EXAM

## 2021-10-27 ENCOUNTER — Encounter: Payer: Self-pay | Admitting: Primary Care

## 2021-10-27 ENCOUNTER — Other Ambulatory Visit: Payer: Self-pay

## 2021-10-27 ENCOUNTER — Ambulatory Visit (INDEPENDENT_AMBULATORY_CARE_PROVIDER_SITE_OTHER): Payer: Medicare Other | Admitting: Primary Care

## 2021-10-27 VITALS — BP 140/70 | HR 74 | Temp 98.3°F | Ht 69.0 in | Wt 185.0 lb

## 2021-10-27 DIAGNOSIS — I10 Essential (primary) hypertension: Secondary | ICD-10-CM

## 2021-10-27 DIAGNOSIS — E785 Hyperlipidemia, unspecified: Secondary | ICD-10-CM

## 2021-10-27 DIAGNOSIS — R972 Elevated prostate specific antigen [PSA]: Secondary | ICD-10-CM

## 2021-10-27 MED ORDER — ATORVASTATIN CALCIUM 20 MG PO TABS: 20.0000 mg | ORAL_TABLET | Freq: Every day | ORAL | 3 refills | Status: DC

## 2021-10-27 MED ORDER — VALSARTAN-HYDROCHLOROTHIAZIDE 160-12.5 MG PO TABS: 1.0000 | ORAL_TABLET | Freq: Every day | ORAL | 3 refills | Status: DC

## 2021-10-27 NOTE — Assessment & Plan Note (Signed)
Evident on labs from January 2023 with level of 5.39. Repeat PSA in April 2023.

## 2021-10-27 NOTE — Assessment & Plan Note (Signed)
Improved!  BP readings at home are at goal.  Continue valsartan-HCTZ 160-12.5 mg daily. Refills sent to pharmacy.

## 2021-10-27 NOTE — Patient Instructions (Signed)
Continue valsartan-HCTZ 160-12.5 mg daily for blood pressure.   It was a pleasure to see you today!

## 2021-10-27 NOTE — Progress Notes (Signed)
Subjective:    Patient ID: David Turner, male    DOB: 1951/08/15, 71 y.o.   MRN: [id number]  HPI  Jedi Catalfamo is a very pleasant 71 y.o. male with a history of hypertension, type 2 diabetes, hyperlipidemia, gout, who presents today for follow up of hypertension.  He is also needing refills of atorvastatin 20 mg and he is due for a diabetic foot exam.  He was last evaluate 1 month ago for his annual follow up, BP was noted to be elevated at this visit and a visit in November 2022 despite management on valsartan-HCTZ 80-12.5 mg daily. His home readings were consistent with our office reading so his valsartan-HCTZ was increased to 160-12.5 mg daily.  He is here today for follow up. He is checking his BP at home which is ranging 120's/60's-70's. He is compliant to his valsartan-HCTZ 160-12.5 mg daily.   He denies headaches, dizziness.   BP Readings from Last 3 Encounters:  10/27/21 140/70  09/29/21 (!) 158/80  08/02/21 (!) 163/83        Review of Systems  Respiratory:  Negative for shortness of breath.   Cardiovascular:  Negative for chest pain.  Neurological:  Negative for dizziness and headaches.        Past Medical History:  Diagnosis Date   Diabetes mellitus without complication ([place])    Gout    Hyperlipidemia    Hypertension    Wears dentures    partials, upper and lower    Social History   Socioeconomic History   Marital status: Married    Spouse name: Not on file   Number of children: Not on file   Years of education: Not on file   Highest education level: Not on file  Occupational History   Not on file  Tobacco Use   Smoking status: Former    Types: Cigarettes    Quit date: 2002    Years since quitting: 21.1   Smokeless tobacco: Never   Tobacco comments:    quit 2002  Vaping Use   Vaping Use: Never used  Substance and Sexual Activity   Alcohol use: Yes    Alcohol/week: 0.0 standard drinks    Comment: 6-8 beers   Drug use: Never   Sexual  activity: Not on file  Other Topics Concern   Not on file  Social History Narrative   Married.   Travels to and from [place] often- son still lives there.   Social Determinants of Health   Financial Resource Strain: Not on file  Food Insecurity: Not on file  Transportation Needs: Not on file  Physical Activity: Not on file  Stress: Not on file  Social Connections: Not on file  Intimate Partner Violence: Not on file    Past Surgical History:  Procedure Laterality Date   CATARACT EXTRACTION W/PHACO Left 02/19/2019   Procedure: CATARACT EXTRACTION PHACO AND INTRAOCULAR LENS PLACEMENT ([place])  LEFT;  Surgeon: Leandrew Koyanagi, MD;  Location: [facility];  Service: Ophthalmology;  Laterality: Left;   COLONOSCOPY WITH PROPOFOL N/A 10/28/2020   Procedure: COLONOSCOPY WITH PROPOFOL;  Surgeon: Virgel Manifold, MD;  Location: [facility];  Service: Endoscopy;  Laterality: N/A;   left knee surgeries  1983 1993, 2008   TOTAL HIP ARTHROPLASTY Left 05/19/2020    Family History  Problem Relation Age of Onset   Heart disease Father    Cancer Sister 16       colon   Heart disease Brother  No Known Allergies  Current Outpatient Medications on File Prior to Visit  Medication Sig Dispense Refill   allopurinol (ZYLOPRIM) 300 MG tablet TAKE 1 TABLET(300 MG) BY MOUTH DAILY FOR GOUT PREVENTION 90 tablet 3   amoxicillin (AMOXIL) 500 MG tablet Take 2,000 mg by mouth as directed. Prior to dental procedures     aspirin 81 MG tablet Take 81 mg by mouth daily.     atorvastatin (LIPITOR) 20 MG tablet Take 1 tablet (20 mg total) by mouth daily. For cholesterol. Office visit required for further refills. 30 tablet 0   Garlic 3419 MG CAPS Take 2 capsules by mouth.     metFORMIN (GLUCOPHAGE-XR) 500 MG 24 hr tablet Take 1 tablet (500 mg total) by mouth daily with breakfast. For diabetes. Office visit required for further refills. 90 tablet 0   Multiple Vitamin (MULTIVITAMIN) tablet Take 1  tablet by mouth daily.     Omega-3 Fatty Acids (FISH OIL) 1000 MG CAPS Take by mouth.     tadalafil (CIALIS) 20 MG tablet Take 1 tablet by mouth 30 minutes prior to intercourse as needed. 30 tablet 0   timolol (BETIMOL) 0.5 % ophthalmic solution 1 drop at bedtime.     valsartan-hydrochlorothiazide (DIOVAN-HCT) 160-12.5 MG tablet Take 1 tablet by mouth daily. For blood pressure. 30 tablet 0   Sod Picosulfate-Mag Ox-Cit Acd (CLENPIQ) 10-3.5-12 MG-GM -GM/160ML SOLN Take 1 kit by mouth as directed. At 5 PM evening before procedure, drink 1 bottle of Clenpiq, hydrate, drink (5) 8 oz of water. Then do the same thing 5 hours prior to your procedure. (Patient not taking: Reported on 10/27/2021) 320 mL 0   No current facility-administered medications on file prior to visit.    BP 140/70    Pulse 74    Temp 98.3 F (36.8 C) (Temporal)    Ht $R'5\' 9"'Zn$  (1.753 m)    Wt 185 lb (83.9 kg)    SpO2 97%    BMI 27.32 kg/m  Objective:   Physical Exam Cardiovascular:     Rate and Rhythm: Normal rate and regular rhythm.  Pulmonary:     Effort: Pulmonary effort is normal.     Breath sounds: Normal breath sounds. No wheezing or rales.  Musculoskeletal:     Cervical back: Neck supple.  Skin:    General: Skin is warm and dry.  Neurological:     Mental Status: He is alert and oriented to person, place, and time.          Assessment & Plan:      This visit occurred during the SARS-CoV-2 public health emergency.  Safety protocols were in place, including screening questions prior to the visit, additional usage of staff PPE, and extensive cleaning of exam room while observing appropriate contact time as indicated for disinfecting solutions.

## 2021-11-03 ENCOUNTER — Encounter: Payer: Self-pay | Admitting: Gastroenterology

## 2021-11-04 ENCOUNTER — Ambulatory Visit
Admission: RE | Admit: 2021-11-04 | Discharge: 2021-11-04 | Disposition: A | Discharge status: Home / Self Care | Payer: Medicare Other | Attending: Gastroenterology | Admitting: Gastroenterology

## 2021-11-04 ENCOUNTER — Ambulatory Visit: Payer: Medicare Other | Admitting: Certified Registered Nurse Anesthetist

## 2021-11-04 ENCOUNTER — Encounter
Admission: RE | Disposition: A | Discharge status: Home / Self Care | Payer: Self-pay | Source: Home / Self Care | Attending: Gastroenterology

## 2021-11-04 DIAGNOSIS — D124 Benign neoplasm of descending colon: Secondary | ICD-10-CM | POA: Insufficient documentation

## 2021-11-04 DIAGNOSIS — D126 Benign neoplasm of colon, unspecified: Secondary | ICD-10-CM | POA: Diagnosis not present

## 2021-11-04 DIAGNOSIS — Z87891 Personal history of nicotine dependence: Secondary | ICD-10-CM | POA: Diagnosis not present

## 2021-11-04 DIAGNOSIS — K5730 Diverticulosis of large intestine without perforation or abscess without bleeding: Secondary | ICD-10-CM | POA: Insufficient documentation

## 2021-11-04 DIAGNOSIS — M109 Gout, unspecified: Secondary | ICD-10-CM | POA: Insufficient documentation

## 2021-11-04 DIAGNOSIS — K635 Polyp of colon: Secondary | ICD-10-CM

## 2021-11-04 DIAGNOSIS — Z1211 Encounter for screening for malignant neoplasm of colon: Secondary | ICD-10-CM | POA: Insufficient documentation

## 2021-11-04 DIAGNOSIS — I10 Essential (primary) hypertension: Secondary | ICD-10-CM | POA: Insufficient documentation

## 2021-11-04 DIAGNOSIS — D122 Benign neoplasm of ascending colon: Secondary | ICD-10-CM | POA: Diagnosis not present

## 2021-11-04 DIAGNOSIS — E119 Type 2 diabetes mellitus without complications: Secondary | ICD-10-CM | POA: Diagnosis not present

## 2021-11-04 DIAGNOSIS — E785 Hyperlipidemia, unspecified: Secondary | ICD-10-CM | POA: Diagnosis not present

## 2021-11-04 HISTORY — PX: COLONOSCOPY WITH PROPOFOL: SHX5780

## 2021-11-04 LAB — GLUCOSE, CAPILLARY: Glucose-Capillary: 118 mg/dL — ABNORMAL HIGH (ref 70–99)

## 2021-11-04 SURGERY — COLONOSCOPY WITH PROPOFOL
Anesthesia: General

## 2021-11-04 MED ORDER — PROPOFOL 500 MG/50ML IV EMUL
INTRAVENOUS | Status: AC
  Filled 2021-11-04: qty 150

## 2021-11-04 MED ORDER — PROPOFOL 10 MG/ML IV BOLUS
INTRAVENOUS | Status: DC | PRN
  Administered 2021-11-04: 20 mg via INTRAVENOUS
  Administered 2021-11-04: 60 mg via INTRAVENOUS
  Administered 2021-11-04: 20 mg via INTRAVENOUS

## 2021-11-04 MED ORDER — LIDOCAINE HCL (CARDIAC) PF 100 MG/5ML IV SOSY
PREFILLED_SYRINGE | INTRAVENOUS | Status: DC | PRN
Start: 2021-11-04 — End: 2021-11-04
  Administered 2021-11-04: 50 mg via INTRAVENOUS

## 2021-11-04 MED ORDER — PHENYLEPHRINE HCL-NACL 20-0.9 MG/250ML-% IV SOLN
INTRAVENOUS | Status: AC
  Filled 2021-11-04: qty 250

## 2021-11-04 MED ORDER — PROPOFOL 500 MG/50ML IV EMUL
INTRAVENOUS | Status: DC | PRN
  Administered 2021-11-04: 140 ug/kg/min via INTRAVENOUS

## 2021-11-04 MED ORDER — SODIUM CHLORIDE 0.9 % IV SOLN
INTRAVENOUS | Status: DC
  Administered 2021-11-04: 20 mL/h via INTRAVENOUS

## 2021-11-04 NOTE — Op Note (Signed)
[facility] Gastroenterology Patient Name: David Turner Procedure Date: 11/04/2021 7:20 AM MRN: [id number] Account #: [id number] Date of Birth: August 05, 1951 Admit Type: Outpatient Age: 71 Room: [facility] ENDO ROOM 4 Gender: Male Note Status: Finalized Instrument Name: Jasper Riling [id number] Procedure:             Colonoscopy Indications:           Screening for colorectal malignant neoplasm Providers:             Jonathon Bellows MD, MD Medicines:             Monitored Anesthesia Care Complications:         No immediate complications. Procedure:             Pre-Anesthesia Assessment:                        - Prior to the procedure, a History and Physical was                         performed, and patient medications, allergies and                         sensitivities were reviewed. The patient's tolerance                         of previous anesthesia was reviewed.                        - The risks and benefits of the procedure and the                         sedation options and risks were discussed with the                         patient. All questions were answered and informed                         consent was obtained.                        - ASA Grade Assessment: II - A patient with mild                         systemic disease.                        After obtaining informed consent, the colonoscope was                         passed under direct vision. Throughout the procedure,                         the patient's blood pressure, pulse, and oxygen                         saturations were monitored continuously. The                         Colonoscope was introduced through the anus and  advanced to the the cecum, identified by the                         appendiceal orifice. The colonoscopy was performed                         with ease. The patient tolerated the procedure well.                         The quality of the bowel preparation  was excellent. Findings:      The perianal and digital rectal examinations were normal.      Two sessile polyps were found in the ascending colon. The polyps were 3       to 4 mm in size. These polyps were removed with a cold biopsy forceps.       Resection and retrieval were complete.      A 4 mm polyp was found in the descending colon. The polyp was sessile.       The polyp was removed with a cold snare. Resection and retrieval were       complete.      Multiple small-mouthed diverticula were found in the sigmoid colon.      The exam was otherwise without abnormality on direct and retroflexion       views.      Non-bleeding internal hemorrhoids were found during retroflexion. The       hemorrhoids were large and Grade I (internal hemorrhoids that do not       prolapse). Impression:            - Two 3 to 4 mm polyps in the ascending colon, removed                         with a cold biopsy forceps. Resected and retrieved.                        - One 4 mm polyp in the descending colon, removed with                         a cold snare. Resected and retrieved.                        - Diverticulosis in the sigmoid colon.                        - The examination was otherwise normal on direct and                         retroflexion views. Recommendation:        - Discharge patient to home (with escort).                        - Resume previous diet.                        - Continue present medications.                        - Await pathology results.                        -  Repeat colonoscopy in 3 years for surveillance based                         on pathology results. Procedure Code(s):     --- Professional ---                        [phone], Colonoscopy, flexible; with removal of                         tumor(s), polyp(s), or other lesion(s) by snare                         technique                        45380, 50, Colonoscopy, flexible; with biopsy, single                          or multiple Diagnosis Code(s):     --- Professional ---                        K63.5, Polyp of colon                        Z12.11, Encounter for screening for malignant neoplasm                         of colon                        K57.30, Diverticulosis of large intestine without                         perforation or abscess without bleeding CPT copyright 2019 American Medical Association. All rights reserved. The codes documented in this report are preliminary and upon coder review may  be revised to meet current compliance requirements. Jonathon Bellows, MD Jonathon Bellows MD, MD 11/04/2021 8:21:11 AM This report has been signed electronically. Number of Addenda: 0 Note Initiated On: 11/04/2021 7:20 AM Scope Withdrawal Time: 0 hours 14 minutes 33 seconds  Total Procedure Duration: 0 hours 16 minutes 35 seconds  Estimated Blood Loss:  Estimated blood loss: none.      [facility]

## 2021-11-04 NOTE — Anesthesia Postprocedure Evaluation (Signed)
Anesthesia Post Note  Patient: David Turner  Procedure(s) Performed: COLONOSCOPY WITH PROPOFOL  Patient location during evaluation: Endoscopy Anesthesia Type: General Level of consciousness: awake and alert Pain management: pain level controlled Vital Signs Assessment: post-procedure vital signs reviewed and stable Respiratory status: spontaneous breathing, nonlabored ventilation, respiratory function stable and patient connected to nasal cannula oxygen Cardiovascular status: blood pressure returned to baseline and stable Postop Assessment: no apparent nausea or vomiting Anesthetic complications: no   No notable events documented.   Last Vitals:  Vitals:   11/04/21 0651 11/04/21 0813  BP: (!) 148/94   Pulse: 69   Resp: 20   Temp: (!) 35.8 C (!) 35.8 C  SpO2: 100%     Last Pain:  Vitals:   11/04/21 0833  TempSrc:   PainSc: 0-No pain                 Precious Haws 

## 2021-11-04 NOTE — H&P (Signed)
° ° ° ° , MD °[address], [address], [place], [place], [address] °[address], [address], [place], [place], [address] °Phone: [phone]  °Fax: [phone] ° °Primary Care Physician:  Clark, Katherine K, NP ° ° °Pre-Procedure History & Physical: °HPI:  David Turner is a 70 y.o. male is here for an colonoscopy. °  °Past Medical History:  °Diagnosis Date  ° Diabetes mellitus without complication ([facility])   ° Gout   ° Hyperlipidemia   ° Hypertension   ° Wears dentures   ° partials, upper and lower  ° ° °Past Surgical History:  °Procedure Laterality Date  ° CATARACT EXTRACTION W/PHACO Left 02/19/2019  ° Procedure: CATARACT EXTRACTION PHACO AND INTRAOCULAR LENS PLACEMENT ([facility])  LEFT;  Surgeon: Brasington, Chadwick, MD;  Location: [facility];  Service: Ophthalmology;  Laterality: Left;  ° COLONOSCOPY WITH PROPOFOL N/A 10/28/2020  ° Procedure: COLONOSCOPY WITH PROPOFOL;  Surgeon: Tahiliani, Varnita B, MD;  Location: [facility];  Service: Endoscopy;  Laterality: N/A;  ° EYE SURGERY    ° JOINT REPLACEMENT    ° left knee surgeries  1983 1993, 2008  ° TOTAL HIP ARTHROPLASTY Left 05/19/2020  ° ° °Prior to Admission medications   °Medication Sig Start Date End Date Taking? Authorizing Provider  °allopurinol (ZYLOPRIM) 300 MG tablet TAKE 1 TABLET(300 MG) BY MOUTH DAILY FOR GOUT PREVENTION 10/11/21  Yes Clark, Katherine K, NP  °amoxicillin (AMOXIL) 500 MG tablet Take 2,000 mg by mouth as directed. Prior to dental procedures 08/15/21  Yes [provider]  °aspirin 81 MG tablet Take 81 mg by mouth daily.   Yes [provider]  °atorvastatin (LIPITOR) 20 MG tablet Take 1 tablet (20 mg total) by mouth daily. For cholesterol. 10/27/21  Yes Clark, Katherine K, NP  °Garlic 1000 MG CAPS Take 2 capsules by mouth.   Yes [provider]  °metFORMIN (GLUCOPHAGE-XR) 500 MG 24 hr tablet Take 1 tablet (500 mg total) by mouth daily with breakfast. For diabetes. Office visit required for further  refills. 09/26/21  Yes Clark, Katherine K, NP  °Multiple Vitamin (MULTIVITAMIN) tablet Take 1 tablet by mouth daily.   Yes [provider]  °Omega-3 Fatty Acids (FISH OIL) 1000 MG CAPS Take by mouth.   Yes [provider]  °tadalafil (CIALIS) 20 MG tablet Take 1 tablet by mouth 30 minutes prior to intercourse as needed. 10/07/20  Yes Clark, Katherine K, NP  °timolol (BETIMOL) 0.5 % ophthalmic solution 1 drop at bedtime.   Yes [provider]  °valsartan-hydrochlorothiazide (DIOVAN-HCT) 160-12.5 MG tablet Take 1 tablet by mouth daily. For blood pressure. 10/27/21  Yes Clark, Katherine K, NP  °Sod Picosulfate-Mag Ox-Cit Acd (CLENPIQ) 10-3.5-12 MG-GM -GM/160ML SOLN Take 1 kit by mouth as directed. At 5 PM evening before procedure, drink 1 bottle of Clenpiq, hydrate, drink (5) 8 oz of water. Then do the same thing 5 hours prior to your procedure. °Patient not taking: Reported on 10/27/2021 09/13/21   , , MD  ° ° °Allergies as of 09/13/2021  ° (No Known Allergies)  ° ° °Family History  °Problem Relation Age of Onset  ° Heart disease Father   ° Cancer Sister 60  °     colon  ° Heart disease Brother   ° ° °Social History  ° °Socioeconomic History  ° Marital status: Married  °  Spouse name: Not on file  ° Number of children: Not on file  ° Years of education: Not on file  ° Highest   Highest education level: Not on file  Occupational History   Not on file  Tobacco Use   Smoking status: Former    Types: Cigarettes    Quit date: 2002    Years since quitting: 21.1   Smokeless tobacco: Never   Tobacco comments:    quit 2002  Vaping Use   Vaping Use: Never used  Substance and Sexual Activity   Alcohol use: Yes    Alcohol/week: 0.0 standard drinks    Comment: 6-8 beers   Drug use: Never   Sexual activity: Not on file  Other Topics Concern   Not on file  Social History Narrative   Married.   Travels to and from [place] often- son still lives there.   Social Determinants of Health    Financial Resource Strain: Not on file  Food Insecurity: Not on file  Transportation Needs: Not on file  Physical Activity: Not on file  Stress: Not on file  Social Connections: Not on file  Intimate Partner Violence: Not on file    Review of Systems: See HPI, otherwise negative ROS  Physical Exam: BP (!) 148/94    Pulse 69    Temp (!) 96.5 F (35.8 C) (Temporal)    Resp 20    Ht 5' 9" (1.753 m)    Wt 81.6 kg    SpO2 100%    BMI 26.58 kg/m  General:   Alert,  pleasant and cooperative in NAD Head:  Normocephalic and atraumatic. Neck:  Supple; no masses or thyromegaly. Lungs:  Clear throughout to auscultation, normal respiratory effort.    Heart:  +S1, +S2, Regular rate and rhythm, No edema. Abdomen:  Soft, nontender and nondistended. Normal bowel sounds, without guarding, and without rebound.   Neurologic:  Alert and  oriented x4;  grossly normal neurologically.  Impression/Plan: David Turner is here for an colonoscopy to be performed for Screening colonoscopy average risk   Risks, benefits, limitations, and alternatives regarding  colonoscopy have been reviewed with the patient.  Questions have been answered.  All parties agreeable.   Jonathon Bellows, MD  11/04/2021, 7:47 AM

## 2021-11-04 NOTE — Anesthesia Preprocedure Evaluation (Signed)
Anesthesia Evaluation  Patient identified by MRN, date of birth, ID band Patient awake    Reviewed: Allergy & Precautions, NPO status , Patient's Chart, lab work & pertinent test results  History of Anesthesia Complications Negative for: history of anesthetic complications  Airway Mallampati: III  TM Distance: >3 FB Neck ROM: full    Dental  (+) Chipped, Partial Upper, Partial Lower, Poor Dentition   Pulmonary neg shortness of breath, former smoker,    Pulmonary exam normal        Cardiovascular Exercise Tolerance: Good hypertension, (-) angina(-) Past MI and (-) DOE Normal cardiovascular exam     Neuro/Psych  Neuromuscular disease negative psych ROS   GI/Hepatic negative GI ROS, Neg liver ROS, neg GERD  ,  Endo/Other  diabetes, Type 2  Renal/GU negative Renal ROS  negative genitourinary   Musculoskeletal   Abdominal   Peds  Hematology negative hematology ROS (+)   Anesthesia Other Findings Past Medical History: No date: Diabetes mellitus without complication ([facility]) No date: Gout No date: Hyperlipidemia No date: Hypertension No date: Wears dentures     Comment:  partials, upper and lower  Past Surgical History: 02/19/2019: CATARACT EXTRACTION W/PHACO; Left     Comment:  Procedure: CATARACT EXTRACTION PHACO AND INTRAOCULAR               LENS PLACEMENT ([place])  LEFT;  Surgeon: Leandrew Koyanagi, MD;  Location: [place];  Service:               Ophthalmology;  Laterality: Left; 10/28/2020: COLONOSCOPY WITH PROPOFOL; N/A     Comment:  Procedure: COLONOSCOPY WITH PROPOFOL;  Surgeon:               Virgel Manifold, MD;  Location: [facility];                Service: Endoscopy;  Laterality: N/A; No date: EYE SURGERY No date: [place], 2008: left knee surgeries 05/19/2020: TOTAL HIP ARTHROPLASTY; Left  BMI    Body Mass Index: 26.58 kg/m       Reproductive/Obstetrics negative OB ROS                             Anesthesia Physical Anesthesia Plan  ASA: 3  Anesthesia Plan: General   Post-op Pain Management:    Induction: Intravenous  PONV Risk Score and Plan: Propofol infusion and TIVA  Airway Management Planned: Natural Airway and Nasal Cannula  Additional Equipment:   Intra-op Plan:   Post-operative Plan:   Informed Consent: I have reviewed the patients History and Physical, chart, labs and discussed the procedure including the risks, benefits and alternatives for the proposed anesthesia with the patient or authorized representative who has indicated his/her understanding and acceptance.     Dental Advisory Given  Plan Discussed with: Anesthesiologist, CRNA and Surgeon  Anesthesia Plan Comments: (Patient consented for risks of anesthesia including but not limited to:  - adverse reactions to medications - risk of airway placement if required - damage to eyes, teeth, lips or other oral mucosa - nerve damage due to positioning  - sore throat or hoarseness - Damage to heart, brain, nerves, lungs, other parts of body or loss of life  Patient voiced understanding.)        Anesthesia Quick Evaluation

## 2021-11-04 NOTE — Anesthesia Procedure Notes (Signed)
Date/Time: 11/04/2021 7:53 AM Performed by: Lily Peer, , CRNA Pre-anesthesia Checklist: Patient identified, Emergency Drugs available, Suction available, Patient being monitored and Timeout performed Patient Re-evaluated:Patient Re-evaluated prior to induction Oxygen Delivery Method: Nasal cannula Induction Type: IV induction

## 2021-11-04 NOTE — Transfer of Care (Signed)
Immediate Anesthesia Transfer of Care Note  Patient: Keagon Glascoe  Procedure(s) Performed: COLONOSCOPY WITH PROPOFOL  Patient Location: Endoscopy Unit  Anesthesia Type:General  Level of Consciousness: drowsy  Airway & Oxygen Therapy: Patient Spontanous Breathing  Post-op Assessment: Report given to RN and Post -op Vital signs reviewed and stable  Post vital signs: Reviewed and stable  Last Vitals:  Vitals Value Taken Time  BP 94/63 11/04/21 0813  Temp    Pulse 59 11/04/21 0812  Resp 15 11/04/21 0812  SpO2 95 % 11/04/21 0812  Vitals shown include unvalidated device data.  Last Pain:  Vitals:   11/04/21 0651  TempSrc: Temporal  PainSc: 0-No pain         Complications: No notable events documented.

## 2021-11-07 ENCOUNTER — Encounter: Payer: Self-pay | Admitting: Gastroenterology

## 2021-11-07 LAB — SURGICAL PATHOLOGY

## 2021-11-08 ENCOUNTER — Telehealth: Payer: Self-pay | Admitting: Primary Care

## 2021-11-08 NOTE — Telephone Encounter (Signed)
LVM for pt to rtn my call to schedule AWV with [facility]. Please schedule this appt if pt calls the office.  °

## 2021-11-16 NOTE — Progress Notes (Signed)
Subjective:   David Turner is a 71 y.o. male who presents for Medicare Annual/Subsequent preventive examination.  I connected with [name] today by telephone and verified that I am speaking with the correct person using two identifiers. Location patient: home Location provider: work Persons participating in the virtual visit: patient, Engineer, civil (consulting).    I discussed the limitations, risks, security and privacy concerns of performing an evaluation and management service by telephone and the availability of in person appointments. I also discussed with the patient that there may be a patient responsible charge related to this service. The patient expressed understanding and verbally consented to this telephonic visit.    Interactive audio and video telecommunications were attempted between this provider and patient, however failed, due to patient having technical difficulties OR patient did not have access to video capability.  We continued and completed visit with audio only.  Some vital signs may be absent or patient reported.   Time Spent with patient on telephone encounter: 20 minutes  Review of Systems     Cardiac Risk Factors include: advanced age (>10men, >29 women);diabetes mellitus;hypertension;dyslipidemia     Objective:    Today's Vitals   11/17/21 1356  Weight: 180 lb (81.6 kg)  Height: 5\' 9"  (1.753 m)   Body mass index is 26.58 kg/m.  Advanced Directives 11/17/2021 11/04/2021 10/28/2020 02/19/2019 05/07/2018 01/16/2018  Does Patient Have a Medical Advance Directive? No No No No No No  Would patient like information on creating a medical advance directive? Yes (MAU/Ambulatory/Procedural Areas - Information given) - No - Patient declined No - Patient declined No - Patient declined Yes (MAU/Ambulatory/Procedural Areas - Information given)    Current Medications (verified) Outpatient Encounter Medications as of 11/17/2021  Medication Sig   allopurinol (ZYLOPRIM) 300 MG tablet TAKE  1 TABLET(300 MG) BY MOUTH DAILY FOR GOUT PREVENTION   aspirin 81 MG tablet Take 81 mg by mouth daily.   atorvastatin (LIPITOR) 20 MG tablet Take 1 tablet (20 mg total) by mouth daily. For cholesterol.   Garlic 1000 MG CAPS Take 2 capsules by mouth.   metFORMIN (GLUCOPHAGE-XR) 500 MG 24 hr tablet Take 1 tablet (500 mg total) by mouth daily with breakfast. For diabetes. Office visit required for further refills.   Multiple Vitamin (MULTIVITAMIN) tablet Take 1 tablet by mouth daily.   Omega-3 Fatty Acids (FISH OIL) 1000 MG CAPS Take by mouth.   Sod Picosulfate-Mag Ox-Cit Acd (CLENPIQ) 10-3.5-12 MG-GM -GM/160ML SOLN Take 1 kit by mouth as directed. At 5 PM evening before procedure, drink 1 bottle of Clenpiq, hydrate, drink (5) 8 oz of water. Then do the same thing 5 hours prior to your procedure.   tadalafil (CIALIS) 20 MG tablet Take 1 tablet by mouth 30 minutes prior to intercourse as needed.   timolol (BETIMOL) 0.5 % ophthalmic solution 1 drop at bedtime.   valsartan-hydrochlorothiazide (DIOVAN-HCT) 160-12.5 MG tablet Take 1 tablet by mouth daily. For blood pressure.   amoxicillin (AMOXIL) 500 MG tablet Take 2,000 mg by mouth as directed. Prior to dental procedures (Patient not taking: Reported on 11/17/2021)   No facility-administered encounter medications on file as of 11/17/2021.    Allergies (verified) Patient has no known allergies.   History: Past Medical History:  Diagnosis Date   Diabetes mellitus without complication (HCC)    Gout    Hyperlipidemia    Hypertension    Wears dentures    partials, upper and lower   Past Surgical History:  Procedure Laterality Date  CATARACT EXTRACTION W/PHACO Left 02/19/2019   Procedure: CATARACT EXTRACTION PHACO AND INTRAOCULAR LENS PLACEMENT ([facility])  LEFT;  Surgeon: Leandrew Koyanagi, MD;  Location: [facility];  Service: Ophthalmology;  Laterality: Left;   COLONOSCOPY WITH PROPOFOL N/A 10/28/2020   Procedure: COLONOSCOPY WITH PROPOFOL;   Surgeon: Virgel Manifold, MD;  Location: [facility] ENDOSCOPY;  Service: Endoscopy;  Laterality: N/A;   COLONOSCOPY WITH PROPOFOL N/A 11/04/2021   Procedure: COLONOSCOPY WITH PROPOFOL;  Surgeon: Jonathon Bellows, MD;  Location: [facility] ENDOSCOPY;  Service: Gastroenterology;  Laterality: N/A;   EYE SURGERY     JOINT REPLACEMENT     left knee surgeries  1983 1993, 2008   TOTAL HIP ARTHROPLASTY Left 05/19/2020   Family History  Problem Relation Age of Onset   Heart disease Father    Cancer Sister 96       colon   Heart disease Brother    Social History   Socioeconomic History   Marital status: Married    Spouse name: Not on file   Number of children: Not on file   Years of education: Not on file   Highest education level: Not on file  Occupational History   Not on file  Tobacco Use   Smoking status: Former    Types: Cigarettes    Quit date: 2002    Years since quitting: 21.1   Smokeless tobacco: Never   Tobacco comments:    quit 2002  Vaping Use   Vaping Use: Never used  Substance and Sexual Activity   Alcohol use: Yes    Alcohol/week: 0.0 standard drinks    Comment: 6-8 beers   Drug use: Never   Sexual activity: Not on file  Other Topics Concern   Not on file  Social History Narrative   Married.   Travels to and from [place] often- son still lives there.   Social Determinants of Health   Financial Resource Strain: Low Risk    Difficulty of Paying Living Expenses: Not hard at all  Food Insecurity: No Food Insecurity   Worried About Charity fundraiser in the Last Year: Never true   [place] in the Last Year: Never true  Transportation Needs: No Transportation Needs   Lack of Transportation (Medical): No   Lack of Transportation (Non-Medical): No  Physical Activity: Inactive   Days of Exercise per Week: 0 days   Minutes of Exercise per Session: 0 min  Stress: No Stress Concern Present   Feeling of Stress : Not at all  Social Connections: Moderately Isolated    Frequency of Communication with Friends and Family: More than three times a week   Frequency of Social Gatherings with Friends and Family: More than three times a week   Attends Religious Services: Never   Marine scientist or Organizations: No   Attends Music therapist: Never   Marital Status: Married    Tobacco Counseling Counseling given: Not Answered Tobacco comments: quit 2002   Clinical Intake:  Pre-visit preparation completed: Yes  Pain : No/denies pain     BMI - recorded: 26.58 Nutritional Status: BMI 25 -29 Overweight Nutritional Risks: None Diabetes: Yes CBG done?: No Did pt. bring in CBG monitor from home?: No  How often do you need to have someone help you when you read instructions, pamphlets, or other written materials from your doctor or pharmacy?: 1 - Never  Diabetes:  Is the patient diabetic?  Yes  If diabetic, was a CBG obtained  today?  No  Did the patient bring in their glucometer from home?  No  How often do you monitor your CBG's? Patent states CBG is monitored at appointments with PCP.   Financial Strains and Diabetes Management:  Are you having any financial strains with the device, your supplies or your medication? No .  Does the patient want to be seen by Chronic Care Management for management of their diabetes?  No  Would the patient like to be referred to a Nutritionist or for Diabetic Management?  No   Diabetic Exams:  Diabetic Eye Exam: Completed 10/2021.   Diabetic Foot Exam: Pt has an upcoming appointment.    Interpreter Needed?: No  Information entered by :: Orrin Brigham LPN   Activities of Daily Living In your present state of health, do you have any difficulty performing the following activities: 11/17/2021 10/27/2021  Hearing? N N  Vision? N N  Difficulty concentrating or making decisions? N N  Walking or climbing stairs? N N  Dressing or bathing? N N  Doing errands, shopping? N N  Preparing Food and  eating ? N -  Using the Toilet? N -  In the past six months, have you accidently leaked urine? N -  Do you have problems with loss of bowel control? N -  Managing your Medications? N -  Managing your Finances? N -  Housekeeping or managing your Housekeeping? N -  Some recent data might be hidden    Patient Care Team: Pleas Koch, NP as PCP - General (Internal Medicine) Lovell Sheehan, MD as Consulting Physician (Orthopedic Surgery)  Indicate any recent Medical Services you may have received from other than [facility] providers in the past year (date may be approximate).     Assessment:   This is a routine wellness examination for [place].  Hearing/Vision screen Hearing Screening - Comments:: No issues  Vision Screening - Comments:: Last exam 10/2021, Dr. Thomasene Ripple   Dietary issues and exercise activities discussed: Current Exercise Habits: The patient does not participate in regular exercise at present;The patient has a physically strenuous job, but has no regular exercise apart from work.   Goals Addressed             This Visit's Progress    Patient Stated       Would like to drink more water and eat healthier        Depression Screen PHQ 2/9 Scores 11/17/2021 10/27/2021 10/26/2020 09/30/2019 01/16/2018 11/26/2017 09/29/2015  PHQ - 2 Score 0 0 0 0 0 0 0  PHQ- 9 Score - 0 0 - - - -    Fall Risk Fall Risk  11/17/2021 10/27/2021 10/26/2020 04/14/2019 01/16/2018  Falls in the past year? 0 0 1 (No Data) No  Comment - - - Emmi Telephone Survey: data to providers prior to load -  Number falls in past yr: 0 0 0 (No Data) -  Comment - - - Emmi Telephone Survey Actual Response =  -  Injury with Fall? 0 0 0 - -  Risk for fall due to : No Fall Risks - - - -  Follow up Falls prevention discussed - - - -    FALL RISK PREVENTION PERTAINING TO THE HOME:  Any stairs in or around the home? Yes  If so, are there any without handrails? No  Home free of loose throw rugs in walkways, pet beds,  electrical cords, etc? No  Adequate lighting in your home to reduce risk  of falls? Yes   ASSISTIVE DEVICES UTILIZED TO PREVENT FALLS:  Life alert? No  Use of a cane, walker or w/c? No  Grab bars in the bathroom? Yes  Shower chair or bench in shower? Yes  Elevated toilet seat or a handicapped toilet? Yes   TIMED UP AND GO:  Was the test performed? No .    Cognitive Function:  Normal cognitive status assessed by this Nurse Health Advisor. No abnormalities found.        Immunizations Immunization History  Administered Date(s) Administered   Fluad Quad(high Dose 65+) 06/19/2019, 06/16/2020, 07/15/2021   PFIZER(Purple Top)SARS-COV-2 Vaccination 10/26/2019, 11/25/2019, 06/23/2020   Pfizer Covid-19 Vaccine Bivalent Booster 35yrs & up 07/04/2021   Pneumococcal Conjugate-13 11/26/2017   Pneumococcal Polysaccharide-23 06/19/2019   Tdap 11/26/2017   Zoster Recombinat (Shingrix) 07/08/2019, 09/09/2019   Zoster, Live 11/05/2013    TDAP status: Up to date  Flu Vaccine status: Up to date  Pneumococcal vaccine status: Up to date  Covid-19 vaccine status: Completed vaccines  Qualifies for Shingles Vaccine? Yes   Zostavax completed Yes   Shingrix Completed?: Yes  Screening Tests Health Maintenance  Topic Date Due   FOOT EXAM  Never done   OPHTHALMOLOGY EXAM  10/25/2021   HEMOGLOBIN A1C  03/29/2022   COLONOSCOPY (Pts 45-56yrs Insurance coverage will need to be confirmed)  11/04/2022   TETANUS/TDAP  11/27/2027   Pneumonia Vaccine 63+ Years old  Completed   INFLUENZA VACCINE  Completed   COVID-19 Vaccine  Completed   Hepatitis C Screening  Completed   Zoster Vaccines- Shingrix  Completed   HPV VACCINES  Aged Out    Health Maintenance  Health Maintenance Due  Topic Date Due   FOOT EXAM  Never done   OPHTHALMOLOGY EXAM  10/25/2021    Colorectal cancer screening: Type of screening: Colonoscopy. Completed 11/04/21. Repeat every 1 years  Lung Cancer Screening: (Low Dose  CT Chest recommended if Age 33-80 years, 30 pack-year currently smoking OR have quit w/in 15years.) does not qualify.     Additional Screening:  Hepatitis C Screening: does qualify; Completed 09/29/15  Vision Screening: Recommended annual ophthalmology exams for early detection of glaucoma and other disorders of the eye. Is the patient up to date with their annual eye exam?  Yes  Who is the provider or what is the name of the office in which the patient attends annual eye exams? Dr. Thomasene Ripple   Dental Screening: Recommended annual dental exams for proper oral hygiene  Community Resource Referral / Chronic Care Management: CRR required this visit?  No   CCM required this visit?  No      Plan:     I have personally reviewed and noted the following in the patients chart:   Medical and social history Use of alcohol, tobacco or illicit drugs  Current medications and supplements including opioid prescriptions. Patient is not currently taking opioid prescriptions. Functional ability and status Nutritional status Physical activity Advanced directives List of other physicians Hospitalizations, surgeries, and ER visits in previous 12 months Vitals Screenings to include cognitive, depression, and falls Referrals and appointments  In addition, I have reviewed and discussed with patient certain preventive protocols, quality metrics, and best practice recommendations. A written personalized care plan for preventive services as well as general preventive health recommendations were provided to patient.   Due to this being a telephonic visit, the after visit summary with patients personalized plan was offered to patient via mail or my-chart.  Patient would  like to access on my-chart.     Loma Messing, LPN   04/15/7307   Nurse Health Advisor  Nurse Notes: none

## 2021-11-17 ENCOUNTER — Ambulatory Visit (INDEPENDENT_AMBULATORY_CARE_PROVIDER_SITE_OTHER): Payer: Medicare Other

## 2021-11-17 VITALS — Ht 69.0 in | Wt 180.0 lb

## 2021-11-17 DIAGNOSIS — Z0000 Encounter for general adult medical examination without abnormal findings: Secondary | ICD-10-CM | POA: Diagnosis not present

## 2021-11-17 NOTE — Patient Instructions (Signed)
David Turner , Thank you for taking time to complete your Medicare Wellness Visit. I appreciate your ongoing commitment to your health goals. Please review the following plan we discussed and let me know if I can assist you in the future.   Screening recommendations/referrals: Colonoscopy: up to date , completed 11/04/21 due 11/04/24 Ophthalmology/optometry visit : up to date  Recommended yearly dental visit for hygiene and checkup  Vaccinations: Influenza vaccine: up to date  Pneumococcal vaccine: up to date  Tdap vaccine: up to date  Shingles vaccine: up to date    Covid-19: up to date   Advanced directives: Please bring a copy of Living Will and/or [place] for your chart, when available    Conditions/risks identified: see problem list   Next appointment: Follow up in one year for your annual wellness visit.   Preventive Care 55 Years and Older, Male Preventive care refers to lifestyle choices and visits with your health care provider that can promote health and wellness. What does preventive care include? A yearly physical exam. This is also called an annual well check. Dental exams once or twice a year. Routine eye exams. Ask your health care provider how often you should have your eyes checked. Personal lifestyle choices, including: Daily care of your teeth and gums. Regular physical activity. Eating a healthy diet. Avoiding tobacco and drug use. Limiting alcohol use. Practicing safe sex. Taking low doses of aspirin every day. Taking vitamin and mineral supplements as recommended by your health care provider. What happens during an annual well check? The services and screenings done by your health care provider during your annual well check will depend on your age, overall health, lifestyle risk factors, and family history of disease. Counseling  Your health care provider may ask you questions about your: Alcohol use. Tobacco use. Drug use. Emotional  well-being. Home and relationship well-being. Sexual activity. Eating habits. History of falls. Memory and ability to understand (cognition). Work and work Statistician. Screening  You may have the following tests or measurements: Height, weight, and BMI. Blood pressure. Lipid and cholesterol levels. These may be checked every 5 years, or more frequently if you are over 70 years old. Skin check. Lung cancer screening. You may have this screening every year starting at age 71 if you have a 30-pack-year history of smoking and currently smoke or have quit within the past 15 years. Fecal occult blood test (FOBT) of the stool. You may have this test every year starting at age 71. Flexible sigmoidoscopy or colonoscopy. You may have a sigmoidoscopy every 5 years or a colonoscopy every 10 years starting at age 71. Prostate cancer screening. Recommendations will vary depending on your family history and other risks. Hepatitis C blood test. Hepatitis B blood test. Sexually transmitted disease (STD) testing. Diabetes screening. This is done by checking your blood sugar (glucose) after you have not eaten for a while (fasting). You may have this done every 1-3 years. Abdominal aortic aneurysm (AAA) screening. You may need this if you are a current or former smoker. Osteoporosis. You may be screened starting at age 71 if you are at high risk. Talk with your health care provider about your test results, treatment options, and if necessary, the need for more tests. Vaccines  Your health care provider may recommend certain vaccines, such as: Influenza vaccine. This is recommended every year. Tetanus, diphtheria, and acellular pertussis (Tdap, Td) vaccine. You may need a Td booster every 10 years. Zoster vaccine. You may  need this after age 107. Pneumococcal 13-valent conjugate (PCV13) vaccine. One dose is recommended after age 50. Pneumococcal polysaccharide (PPSV23) vaccine. One dose is recommended after  age 73. Talk to your health care provider about which screenings and vaccines you need and how often you need them. This information is not intended to replace advice given to you by your health care provider. Make sure you discuss any questions you have with your health care provider. Document Released: 09/24/2015 Document Revised: 05/17/2016 Document Reviewed: 06/29/2015 Elsevier Interactive Patient Education  2017 [place] Prevention in the Home Falls can cause injuries. They can happen to people of all ages. There are many things you can do to make your home safe and to help prevent falls. What can I do on the outside of my home? Regularly fix the edges of walkways and driveways and fix any cracks. Remove anything that might make you trip as you walk through a door, such as a raised step or threshold. Trim any bushes or trees on the path to your home. Use bright outdoor lighting. Clear any walking paths of anything that might make someone trip, such as rocks or tools. Regularly check to see if handrails are loose or broken. Make sure that both sides of any steps have handrails. Any raised decks and porches should have guardrails on the edges. Have any leaves, snow, or ice cleared regularly. Use sand or salt on walking paths during winter. Clean up any spills in your garage right away. This includes oil or grease spills. What can I do in the bathroom? Use night lights. Install grab bars by the toilet and in the tub and shower. Do not use towel bars as grab bars. Use non-skid mats or decals in the tub or shower. If you need to sit down in the shower, use a plastic, non-slip stool. Keep the floor dry. Clean up any water that spills on the floor as soon as it happens. Remove soap buildup in the tub or shower regularly. Attach bath mats securely with double-sided non-slip rug tape. Do not have throw rugs and other things on the floor that can make you trip. What can I do in the  bedroom? Use night lights. Make sure that you have a light by your bed that is easy to reach. Do not use any sheets or blankets that are too big for your bed. They should not hang down onto the floor. Have a firm chair that has side arms. You can use this for support while you get dressed. Do not have throw rugs and other things on the floor that can make you trip. What can I do in the kitchen? Clean up any spills right away. Avoid walking on wet floors. Keep items that you use a lot in easy-to-reach places. If you need to reach something above you, use a strong step stool that has a grab bar. Keep electrical cords out of the way. Do not use floor polish or wax that makes floors slippery. If you must use wax, use non-skid floor wax. Do not have throw rugs and other things on the floor that can make you trip. What can I do with my stairs? Do not leave any items on the stairs. Make sure that there are handrails on both sides of the stairs and use them. Fix handrails that are broken or loose. Make sure that handrails are as long as the stairways. Check any carpeting to make sure that it is firmly attached  to the stairs. Fix any carpet that is loose or worn. Avoid having throw rugs at the top or bottom of the stairs. If you do have throw rugs, attach them to the floor with carpet tape. Make sure that you have a light switch at the top of the stairs and the bottom of the stairs. If you do not have them, ask someone to add them for you. What else can I do to help prevent falls? Wear shoes that: Do not have high heels. Have rubber bottoms. Are comfortable and fit you well. Are closed at the toe. Do not wear sandals. If you use a stepladder: Make sure that it is fully opened. Do not climb a closed stepladder. Make sure that both sides of the stepladder are locked into place. Ask someone to hold it for you, if possible. Clearly mark and make sure that you can see: Any grab bars or  handrails. First and last steps. Where the edge of each step is. Use tools that help you move around (mobility aids) if they are needed. These include: Canes. Walkers. Scooters. Crutches. Turn on the lights when you go into a dark area. Replace any light bulbs as soon as they burn out. Set up your furniture so you have a clear path. Avoid moving your furniture around. If any of your floors are uneven, fix them. If there are any pets around you, be aware of where they are. Review your medicines with your doctor. Some medicines can make you feel dizzy. This can increase your chance of falling. Ask your doctor what other things that you can do to help prevent falls. This information is not intended to replace advice given to you by your health care provider. Make sure you discuss any questions you have with your health care provider. Document Released: 06/24/2009 Document Revised: 02/03/2016 Document Reviewed: 10/02/2014 Elsevier Interactive Patient Education  2017 [employer].

## 2021-12-12 ENCOUNTER — Other Ambulatory Visit: Payer: Self-pay | Admitting: Primary Care

## 2021-12-12 DIAGNOSIS — R972 Elevated prostate specific antigen [PSA]: Secondary | ICD-10-CM

## 2021-12-23 ENCOUNTER — Other Ambulatory Visit: Payer: Self-pay | Admitting: Primary Care

## 2021-12-23 DIAGNOSIS — E119 Type 2 diabetes mellitus without complications: Secondary | ICD-10-CM

## 2021-12-25 ENCOUNTER — Other Ambulatory Visit: Payer: Self-pay | Admitting: Primary Care

## 2021-12-25 DIAGNOSIS — I10 Essential (primary) hypertension: Secondary | ICD-10-CM

## 2021-12-26 ENCOUNTER — Other Ambulatory Visit: Payer: Self-pay | Admitting: Primary Care

## 2021-12-26 DIAGNOSIS — N529 Male erectile dysfunction, unspecified: Secondary | ICD-10-CM

## 2021-12-27 ENCOUNTER — Other Ambulatory Visit: Payer: Self-pay | Admitting: Primary Care

## 2021-12-27 DIAGNOSIS — N529 Male erectile dysfunction, unspecified: Secondary | ICD-10-CM

## 2021-12-29 ENCOUNTER — Telehealth: Payer: Self-pay

## 2021-12-30 ENCOUNTER — Other Ambulatory Visit (INDEPENDENT_AMBULATORY_CARE_PROVIDER_SITE_OTHER): Payer: Medicare Other

## 2021-12-30 ENCOUNTER — Ambulatory Visit: Payer: 59 | Admitting: Primary Care

## 2021-12-30 DIAGNOSIS — R972 Elevated prostate specific antigen [PSA]: Secondary | ICD-10-CM

## 2021-12-30 LAB — PSA, MEDICARE: PSA: 3.3 ng/ml (ref 0.10–4.00)

## 2021-12-30 NOTE — Telephone Encounter (Signed)
Please notify patient that his tadalafil was denied by his insurance company. ? ?We can try sildenafil 20 mg which is generic Viagra.  He can take 2 to 5 tablets by mouth 1 hour prior to sexual activity.  #50.  No refills.  Please send to pharmacy if he would like to try. ?

## 2022-01-04 ENCOUNTER — Other Ambulatory Visit: Payer: Self-pay | Admitting: Nurse Practitioner

## 2022-01-04 DIAGNOSIS — N529 Male erectile dysfunction, unspecified: Secondary | ICD-10-CM

## 2022-01-04 MED ORDER — SILDENAFIL CITRATE 20 MG PO TABS: ORAL_TABLET | ORAL | 0 refills | Status: DC

## 2022-01-04 NOTE — Telephone Encounter (Signed)
Discontinued the tadalafil and sent in sildenafil  ?

## 2022-01-04 NOTE — Telephone Encounter (Signed)
Spoke to patient by telephone and was advised that he would like to try the Sildenafil. Patient requested that the script be sent to [place], [facility]. ?

## 2022-03-23 ENCOUNTER — Other Ambulatory Visit: Payer: Self-pay | Admitting: Primary Care

## 2022-03-23 DIAGNOSIS — E119 Type 2 diabetes mellitus without complications: Secondary | ICD-10-CM

## 2022-04-02 ENCOUNTER — Other Ambulatory Visit: Payer: Self-pay | Admitting: Primary Care

## 2022-04-02 DIAGNOSIS — E119 Type 2 diabetes mellitus without complications: Secondary | ICD-10-CM

## 2022-04-04 ENCOUNTER — Encounter: Payer: Self-pay | Admitting: Primary Care

## 2022-04-04 ENCOUNTER — Ambulatory Visit (INDEPENDENT_AMBULATORY_CARE_PROVIDER_SITE_OTHER): Payer: Medicare Other | Admitting: Primary Care

## 2022-04-04 VITALS — BP 140/78 | HR 65 | Temp 97.8°F | Ht 69.0 in | Wt 180.0 lb

## 2022-04-04 DIAGNOSIS — E1165 Type 2 diabetes mellitus with hyperglycemia: Secondary | ICD-10-CM

## 2022-04-04 LAB — POCT GLYCOSYLATED HEMOGLOBIN (HGB A1C): Hemoglobin A1C: 5.7 % — AB (ref 4.0–5.6)

## 2022-04-04 NOTE — Assessment & Plan Note (Signed)
Controlled with A1C of 5.7!  Continue metformin XR 500 mg daily. Foot exam today. Eye exam UTD. Pneumonia vaccine UTD. Managed on statin and ARB.  Follow up in 6 months.

## 2022-04-04 NOTE — Progress Notes (Signed)
Subjective:    Patient ID: David Turner, male    DOB: 10/30/1950, 71 y.o.   MRN: [id number]  Diabetes Pertinent negatives for hypoglycemia include no dizziness. Pertinent negatives for diabetes include no chest pain.    David Turner is a very pleasant 71 y.o. male with a history of type 2 diabetes, hypertension, hyperlipidemia who presents today for follow-up of diabetes.  Current medications include: Metformin XR 500 mg daily  He is checking his blood glucose 0 times daily.  Last A1C: 6.4 in January 2023, 5.7 today.  Last Eye Exam: UTD Last Foot Exam: Due Pneumonia Vaccination: 2020 Urine Microalbumin: None.  Managed on ARB. Statin: Atorvastatin  Dietary changes since last visit: No changes. Overall healthy diet. Occasional junk food.    Exercise: Active.   BP Readings from Last 3 Encounters:  04/04/22 140/78  11/04/21 (!) 148/94  10/27/21 140/70       Review of Systems  Eyes:  Negative for visual disturbance.  Respiratory:  Negative for shortness of breath.   Cardiovascular:  Negative for chest pain.  Neurological:  Negative for dizziness and numbness.         Past Medical History:  Diagnosis Date   Diabetes mellitus without complication ([place])    Gout    Hyperlipidemia    Hypertension    Wears dentures    partials, upper and lower    Social History   Socioeconomic History   Marital status: Married    Spouse name: Not on file   Number of children: Not on file   Years of education: Not on file   Highest education level: Not on file  Occupational History   Not on file  Tobacco Use   Smoking status: Former    Types: Cigarettes    Quit date: 2002    Years since quitting: 21.5   Smokeless tobacco: Never   Tobacco comments:    quit 2002  Vaping Use   Vaping Use: Never used  Substance and Sexual Activity   Alcohol use: Yes    Alcohol/week: 0.0 standard drinks of alcohol    Comment: 6-8 beers   Drug use: Never   Sexual activity: Not on file   Other Topics Concern   Not on file  Social History Narrative   Married.   Travels to and from [place] often- son still lives there.   Social Determinants of Health   Financial Resource Strain: Low Risk  (11/17/2021)   Overall Financial Resource Strain (CARDIA)    Difficulty of Paying Living Expenses: Not hard at all  Food Insecurity: No Food Insecurity (11/17/2021)   Hunger Vital Sign    Worried About Running Out of Food in the Last Year: Never true    Ran Out of Food in the Last Year: Never true  Transportation Needs: No Transportation Needs (11/17/2021)   PRAPARE - Hydrologist (Medical): No    Lack of Transportation (Non-Medical): No  Physical Activity: Inactive (11/17/2021)   Exercise Vital Sign    Days of Exercise per Week: 0 days    Minutes of Exercise per Session: 0 min  Stress: No Stress Concern Present (11/17/2021)   [place]    Feeling of Stress : Not at all  Social Connections: Moderately Isolated (11/17/2021)   Social Connection and Isolation Panel [NHANES]    Frequency of Communication with Friends and Family: More than three times a week    Frequency  of Social Gatherings with Friends and Family: More than three times a week    Attends Religious Services: Never    Marine scientist or Organizations: No    Attends Archivist Meetings: Never    Marital Status: Married  Human resources officer Violence: Not At Risk (11/17/2021)   Humiliation, Afraid, Rape, and Kick questionnaire    Fear of Current or Ex-Partner: No    Emotionally Abused: No    Physically Abused: No    Sexually Abused: No    Past Surgical History:  Procedure Laterality Date   CATARACT EXTRACTION W/PHACO Left 02/19/2019   Procedure: CATARACT EXTRACTION PHACO AND INTRAOCULAR LENS PLACEMENT ([place])  LEFT;  Surgeon: Leandrew Koyanagi, MD;  Location: [facility];  Service: Ophthalmology;  Laterality:  Left;   COLONOSCOPY WITH PROPOFOL N/A 10/28/2020   Procedure: COLONOSCOPY WITH PROPOFOL;  Surgeon: Virgel Manifold, MD;  Location: [facility] ENDOSCOPY;  Service: Endoscopy;  Laterality: N/A;   COLONOSCOPY WITH PROPOFOL N/A 11/04/2021   Procedure: COLONOSCOPY WITH PROPOFOL;  Surgeon: Jonathon Bellows, MD;  Location: [facility] ENDOSCOPY;  Service: Gastroenterology;  Laterality: N/A;   EYE SURGERY     JOINT REPLACEMENT     left knee surgeries  1983 1993, 2008   TOTAL HIP ARTHROPLASTY Left 05/19/2020    Family History  Problem Relation Age of Onset   Heart disease Father    Cancer Sister 79       colon   Heart disease Brother     No Known Allergies  Current Outpatient Medications on File Prior to Visit  Medication Sig Dispense Refill   allopurinol (ZYLOPRIM) 300 MG tablet TAKE 1 TABLET(300 MG) BY MOUTH DAILY FOR GOUT PREVENTION 90 tablet 3   aspirin 81 MG tablet Take 81 mg by mouth daily.     atorvastatin (LIPITOR) 20 MG tablet Take 1 tablet (20 mg total) by mouth daily. For cholesterol. 90 tablet 3   Garlic 3086 MG CAPS Take 2 capsules by mouth.     metFORMIN (GLUCOPHAGE-XR) 500 MG 24 hr tablet TAKE 1 TABLET(500 MG) BY MOUTH DAILY WITH BREAKFAST FOR DIABETES 90 tablet 0   Multiple Vitamin (MULTIVITAMIN) tablet Take 1 tablet by mouth daily.     Omega-3 Fatty Acids (FISH OIL) 1000 MG CAPS Take by mouth.     timolol (BETIMOL) 0.5 % ophthalmic solution 1 drop at bedtime.     valsartan-hydrochlorothiazide (DIOVAN-HCT) 160-12.5 MG tablet Take 1 tablet by mouth daily. For blood pressure. 90 tablet 3   amoxicillin (AMOXIL) 500 MG tablet Take 2,000 mg by mouth as directed. Prior to dental procedures (Patient not taking: Reported on 11/17/2021)     sildenafil (REVATIO) 20 MG tablet 2-4 tablets one hour before sexual intercourse (Patient not taking: Reported on 04/04/2022) 50 tablet 0   No current facility-administered medications on file prior to visit.    BP 140/78   Pulse 65   Temp 97.8 F (36.6 C)  (Oral)   Ht '5\' 9"'$  (1.753 m)   Wt 180 lb (81.6 kg)   SpO2 99%   BMI 26.58 kg/m  Objective:   Physical Exam Cardiovascular:     Rate and Rhythm: Normal rate and regular rhythm.  Pulmonary:     Effort: Pulmonary effort is normal.     Breath sounds: Normal breath sounds. No wheezing or rales.  Musculoskeletal:     Cervical back: Neck supple.  Skin:    General: Skin is warm and dry.  Neurological:     Mental  Status: He is alert and oriented to person, place, and time.           Assessment & Plan:   Problem List Items Addressed This Visit       Endocrine   Type 2 diabetes mellitus with hyperglycemia ([place]) - Primary    Controlled with A1C of 5.7!  Continue metformin XR 500 mg daily. Foot exam today. Eye exam UTD. Pneumonia vaccine UTD. Managed on statin and ARB.  Follow up in 6 months.       Relevant Orders   POCT glycosylated hemoglobin (Hb A1C) (Completed)       Pleas Koch, NP

## 2022-04-04 NOTE — Patient Instructions (Addendum)
Keep working on [employer]. Increase regular exercise.   Schedule your follow up visit for late January 2024.   It was a pleasure to see you today!

## 2022-04-24 DIAGNOSIS — H40003 Preglaucoma, unspecified, bilateral: Secondary | ICD-10-CM | POA: Diagnosis not present

## 2022-05-05 NOTE — Telephone Encounter (Signed)
Faxed a request to [facility]

## 2022-05-11 ENCOUNTER — Encounter: Payer: Self-pay | Admitting: Primary Care

## 2022-05-16 ENCOUNTER — Encounter: Payer: Self-pay | Admitting: Primary Care

## 2022-05-29 DIAGNOSIS — Z96642 Presence of left artificial hip joint: Secondary | ICD-10-CM | POA: Diagnosis not present

## 2022-05-29 DIAGNOSIS — M25512 Pain in left shoulder: Secondary | ICD-10-CM | POA: Diagnosis not present

## 2022-06-01 ENCOUNTER — Ambulatory Visit (INDEPENDENT_AMBULATORY_CARE_PROVIDER_SITE_OTHER): Payer: Medicare Other

## 2022-06-01 DIAGNOSIS — Z23 Encounter for immunization: Secondary | ICD-10-CM

## 2022-06-15 IMAGING — DX DG CHEST 2V
2 series · 2 of 2 positions shown · non-contrast
Comparison: None.

CLINICAL DATA: Shortness of breath, cough

EXAM:
CHEST - 2 VIEW

[chest pa]
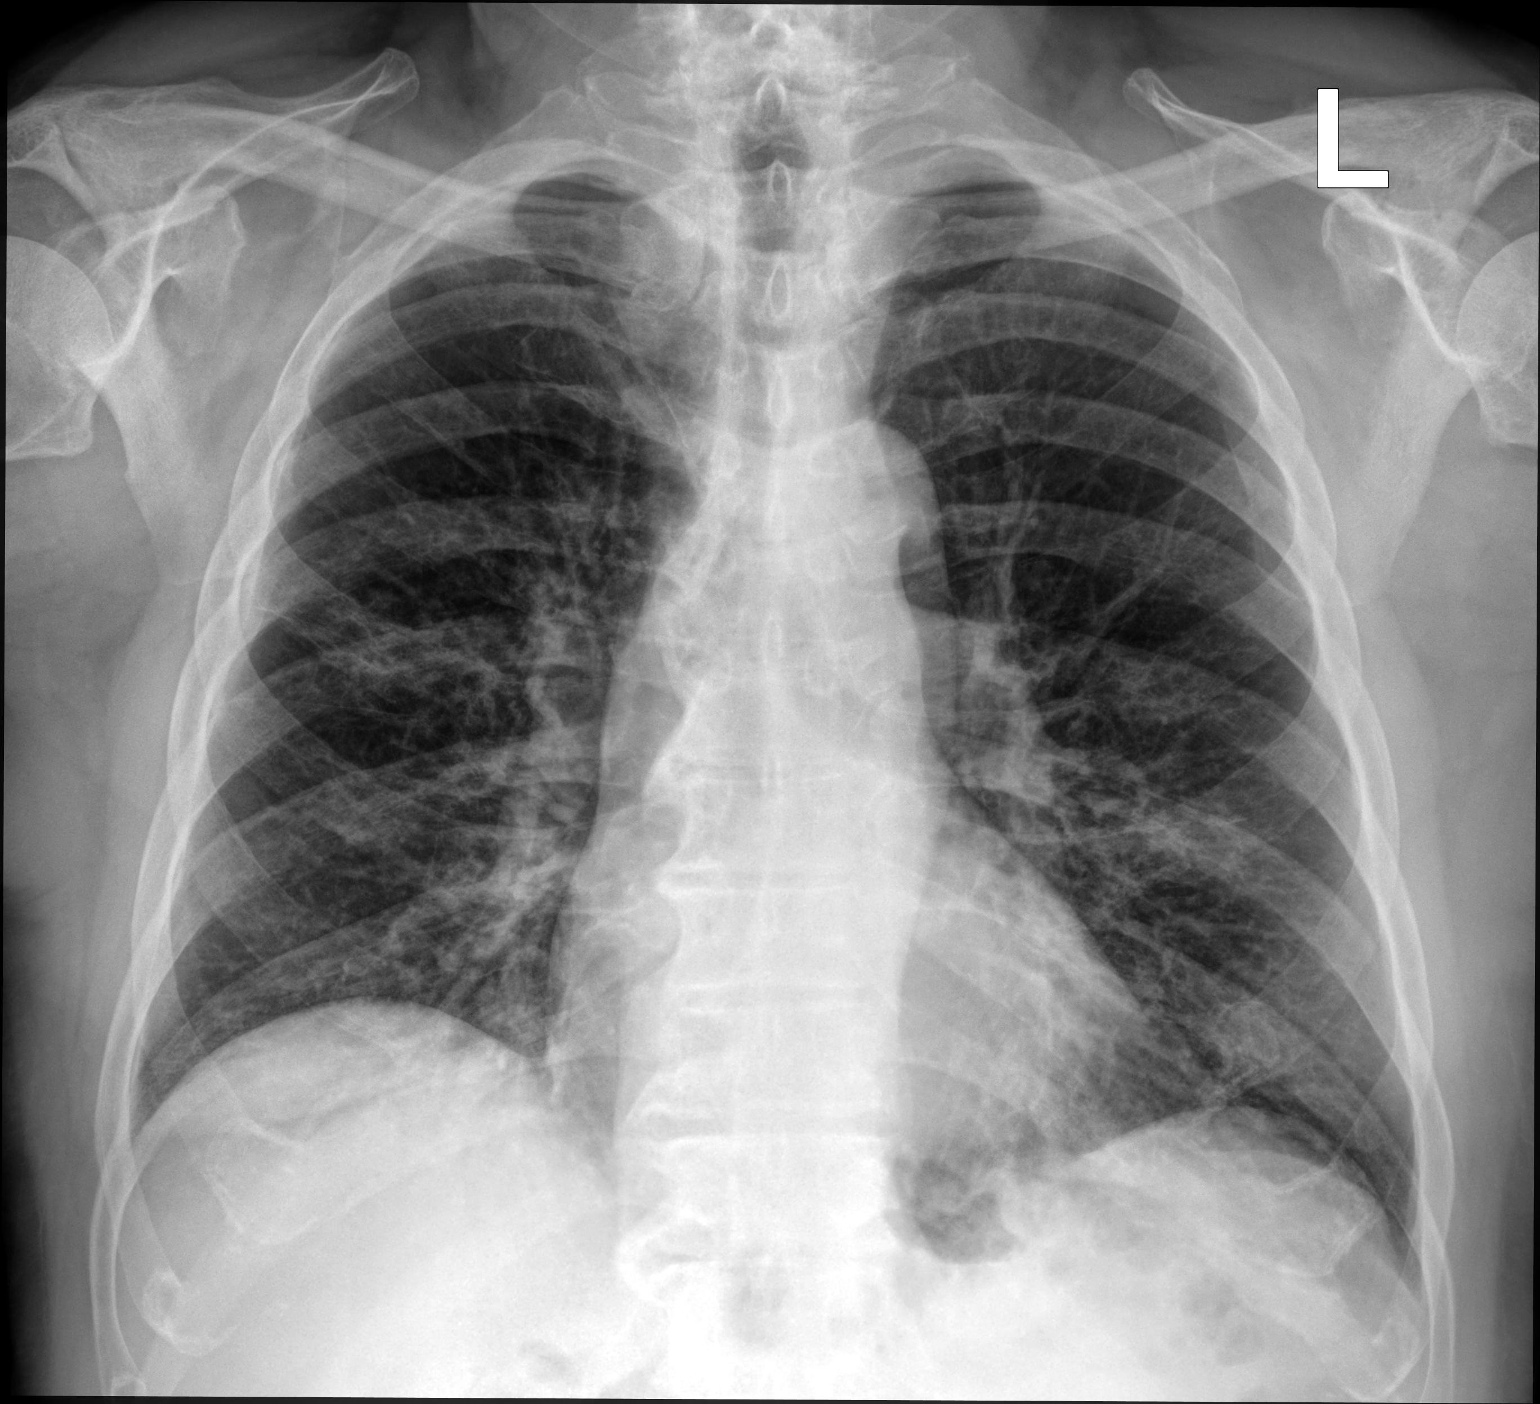

[chest lat]
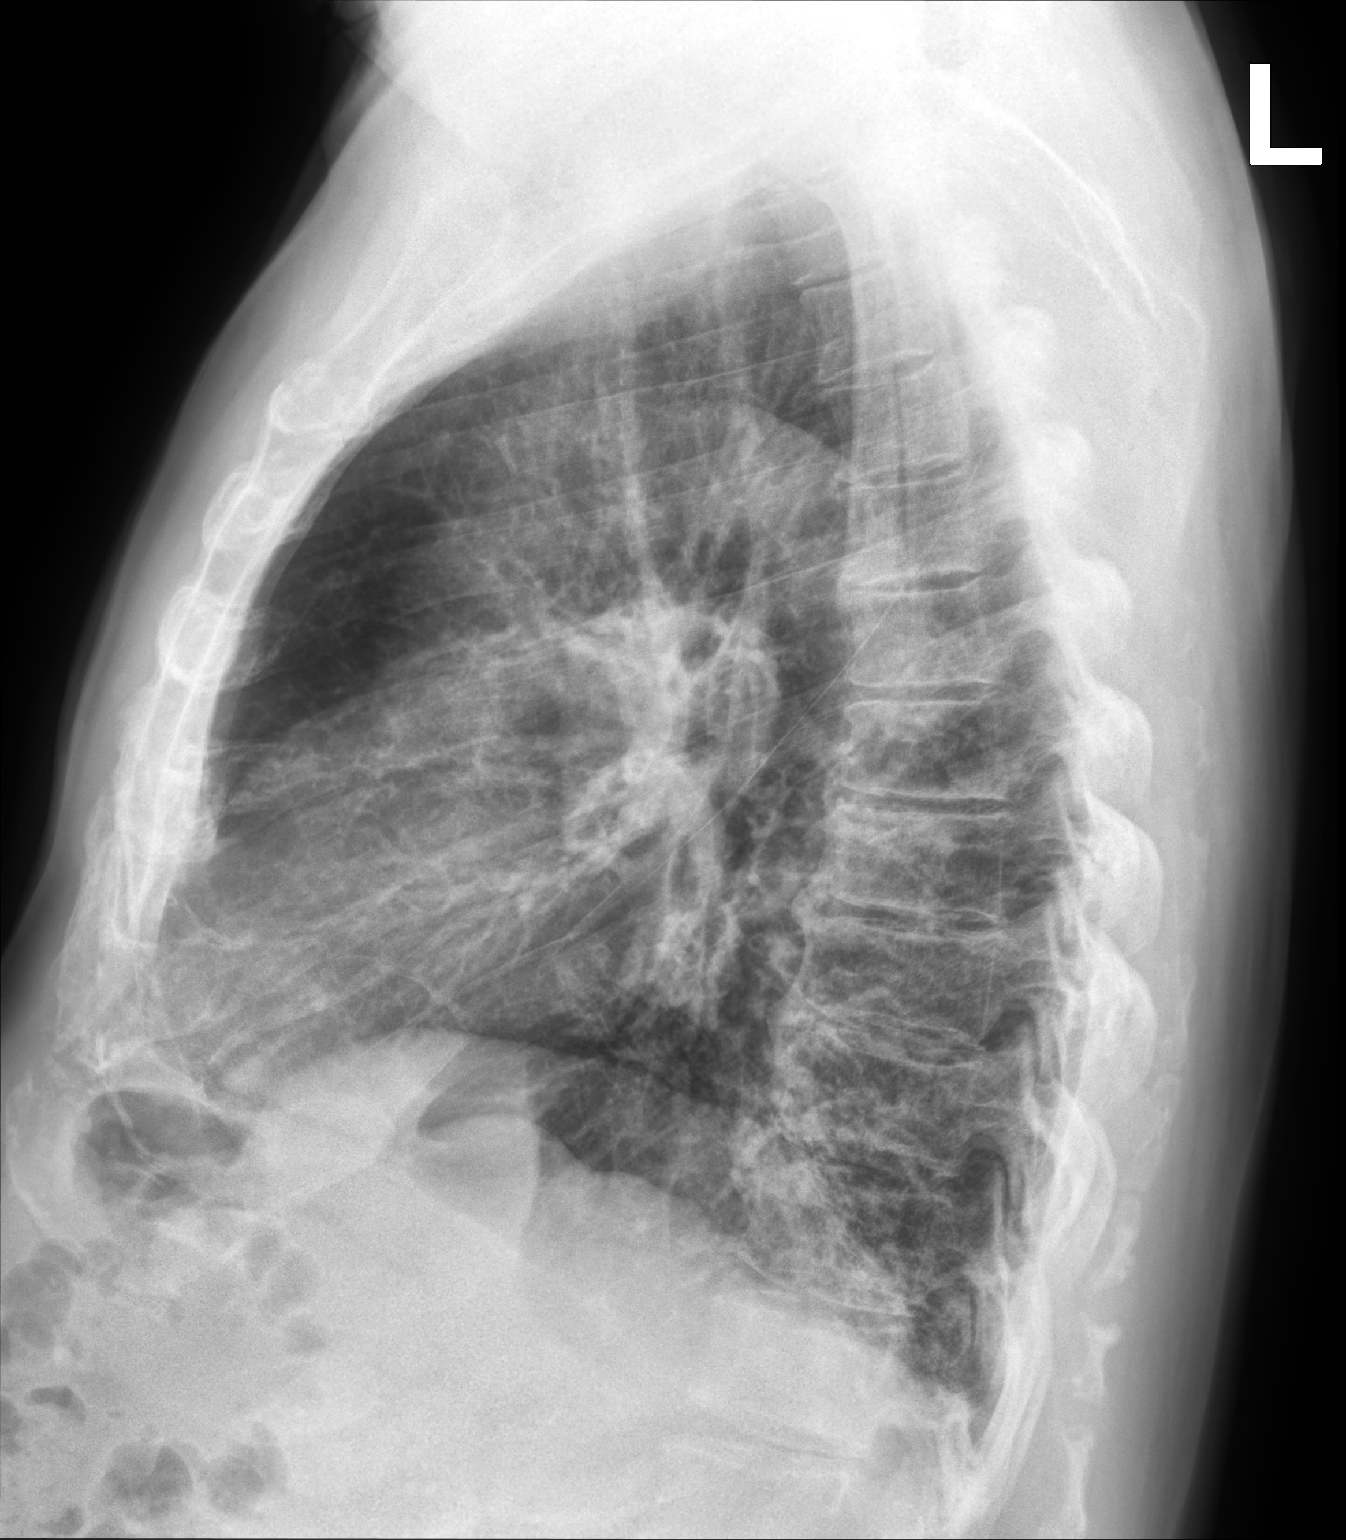

[2 of 2 positions shown; findings below may reference images not displayed]

FINDINGS: Cardiac size is within normal limits. There is increase in
interstitial markings in the parahilar regions and lower lung
fields. There is no focal pulmonary consolidation. There is no
pleural effusion or pneumothorax.
IMPRESSION: There is prominence of interstitial markings in the parahilar
regions and lower lung fields suggesting bronchitis and possibly
interstitial pneumonia. There is no focal pulmonary consolidation.
There is no pleural effusion.

## 2022-06-28 ENCOUNTER — Other Ambulatory Visit: Payer: Self-pay | Admitting: Primary Care

## 2022-06-28 DIAGNOSIS — E119 Type 2 diabetes mellitus without complications: Secondary | ICD-10-CM

## 2022-08-08 ENCOUNTER — Other Ambulatory Visit: Payer: Self-pay | Admitting: Nurse Practitioner

## 2022-08-08 DIAGNOSIS — N529 Male erectile dysfunction, unspecified: Secondary | ICD-10-CM

## 2022-08-10 ENCOUNTER — Other Ambulatory Visit: Payer: Self-pay | Admitting: Nurse Practitioner

## 2022-08-10 DIAGNOSIS — N529 Male erectile dysfunction, unspecified: Secondary | ICD-10-CM

## 2022-10-06 ENCOUNTER — Other Ambulatory Visit: Payer: Self-pay | Admitting: Primary Care

## 2022-10-06 DIAGNOSIS — M109 Gout, unspecified: Secondary | ICD-10-CM

## 2022-10-10 ENCOUNTER — Encounter: Payer: Self-pay | Admitting: Primary Care

## 2022-10-10 ENCOUNTER — Ambulatory Visit (INDEPENDENT_AMBULATORY_CARE_PROVIDER_SITE_OTHER): Payer: Medicare Other | Admitting: Primary Care

## 2022-10-10 VITALS — BP 152/84 | HR 65 | Temp 97.9°F | Ht 69.0 in | Wt 187.0 lb

## 2022-10-10 DIAGNOSIS — Z125 Encounter for screening for malignant neoplasm of prostate: Secondary | ICD-10-CM | POA: Diagnosis not present

## 2022-10-10 DIAGNOSIS — E1165 Type 2 diabetes mellitus with hyperglycemia: Secondary | ICD-10-CM

## 2022-10-10 DIAGNOSIS — N529 Male erectile dysfunction, unspecified: Secondary | ICD-10-CM

## 2022-10-10 DIAGNOSIS — M10479 Other secondary gout, unspecified ankle and foot: Secondary | ICD-10-CM | POA: Diagnosis not present

## 2022-10-10 DIAGNOSIS — Z800 Family history of malignant neoplasm of digestive organs: Secondary | ICD-10-CM

## 2022-10-10 DIAGNOSIS — I10 Essential (primary) hypertension: Secondary | ICD-10-CM

## 2022-10-10 DIAGNOSIS — R972 Elevated prostate specific antigen [PSA]: Secondary | ICD-10-CM

## 2022-10-10 DIAGNOSIS — E785 Hyperlipidemia, unspecified: Secondary | ICD-10-CM

## 2022-10-10 LAB — LIPID PANEL
Cholesterol: 134 mg/dL (ref 0–200)
HDL: 59.5 mg/dL (ref >39.00)
LDL Cholesterol: 57 mg/dL (ref 0–99)
NonHDL: 74.84
Total CHOL/HDL Ratio: 2
Triglycerides: 89 mg/dL (ref 0.0–149.0)
VLDL: 17.8 mg/dL (ref 0.0–40.0)

## 2022-10-10 LAB — PSA, MEDICARE: PSA: 6.45 ng/ml — ABNORMAL HIGH (ref 0.10–4.00)

## 2022-10-10 LAB — URIC ACID: Uric Acid, Serum: 4.5 mg/dL (ref 4.0–7.8)

## 2022-10-10 LAB — MICROALBUMIN / CREATININE URINE RATIO
Creatinine,U: 77.4 mg/dL
Microalb Creat Ratio: 0.9 mg/g (ref 0.0–30.0)
Microalb, Ur: <0.7 mg/dL (ref 0.0–1.9)

## 2022-10-10 LAB — COMPREHENSIVE METABOLIC PANEL
ALT: 24 U/L (ref 0–53)
AST: 24 U/L (ref 0–37)
Albumin: 4.3 g/dL (ref 3.5–5.2)
Alkaline Phosphatase: 85 U/L (ref 39–117)
BUN: 9 mg/dL (ref 6–23)
CO2: 30 mEq/L (ref 19–32)
Calcium: 9.5 mg/dL (ref 8.4–10.5)
Chloride: 102 mEq/L (ref 96–112)
Creatinine, Ser: 0.73 mg/dL (ref 0.40–1.50)
GFR: 91.59 mL/min (ref >60.00)
Glucose, Bld: 97 mg/dL (ref 70–99)
Potassium: 4.3 mEq/L (ref 3.5–5.1)
Sodium: 141 mEq/L (ref 135–145)
Total Bilirubin: 0.7 mg/dL (ref 0.2–1.2)
Total Protein: 6.7 g/dL (ref 6.0–8.3)

## 2022-10-10 LAB — HEMOGLOBIN A1C: Hgb A1c MFr Bld: 6.1 % (ref 4.6–6.5)

## 2022-10-10 NOTE — Assessment & Plan Note (Signed)
Controlled.  Continue sildenafil 20 mg PRN.

## 2022-10-10 NOTE — Assessment & Plan Note (Addendum)
Controlled. No recent flare ups.   Continue Allopurinol 300 mg.  Uric acid level pending.  I evaluated patient, was consulted regarding treatment, and agree with assessment and plan per Tinnie Gens, RN, DNP student.   Allie Bossier, NP-C

## 2022-10-10 NOTE — Patient Instructions (Addendum)
Stop by the lab prior to leaving today. I will notify you of your results once received.   Continue checking your blood pressure at home.   Continue Metformin XR 500 mg daily; Valsartan HCTZ 160-12.5 mg daily, Allopurinol 300 mg once daily and Atorvastatin .   Please notify the pharmacy when you need refills.   Follow up in 6 months.

## 2022-10-10 NOTE — Progress Notes (Signed)
Subjective:    Patient ID: David Turner, male    DOB: Jun 21, 1951, 72 y.o.   MRN: [id number]  HPI  David Turner is a very pleasant 72 y.o. male with a history of hypertension, type 2 diabetes, hyperlipidemia,  who presents today for follow up of chronic conditions.  Immunizations: -Tetanus: Completed in 2019 -Influenza: Completed this season  -Shingles: Completed Shingrix series -Pneumonia: Completed Prevnar 13 in 2019 and Pneumovax in 2020.   Colonoscopy: Completed in 2023, due 2026  PSA: Due  1) Type 2 Diabetes:  Current medications include: metformin XR 500 mg daily  He is checking his blood glucose 0 times daily.  Last A1C: 5.7 in July 2023, due today Last Eye Exam: UTD Last Foot Exam: UTD Pneumonia Vaccination: 2020 Urine Microalbumin: Due Statin: atorvastatin   Dietary changes since last visit: None   Exercise: Active at home.  2) Essential Hypertension: Currently managed on valsartan-HCTZ 106-12.5 mg daily.  He is checking BP at home which runs 120's/80's. He denies chest pain, dizziness, headaches.   BP Readings from Last 3 Encounters:  10/10/22 (!) 152/84  04/04/22 140/78  11/04/21 (!) 148/94   3) Chronic Gout: Currently managed on allopurinol 300 mg daily. He is due for repeat uric acid level today.   He denies recent gout flare.     Review of Systems  Respiratory:  Negative for shortness of breath.   Cardiovascular:  Negative for chest pain.  Gastrointestinal:  Negative for constipation and diarrhea.  Musculoskeletal:  Negative for arthralgias.  Neurological:  Negative for dizziness and headaches.  Psychiatric/Behavioral:  The patient is not nervous/anxious.          Past Medical History:  Diagnosis Date   Diabetes mellitus without complication ([place])    Ear fullness, bilateral 09/29/2021   Gout    Hyperlipidemia    Hypertension    Wears dentures    partials, upper and lower    Social History   Socioeconomic History   Marital  status: Married    Spouse name: Not on file   Number of children: Not on file   Years of education: Not on file   Highest education level: Not on file  Occupational History   Not on file  Tobacco Use   Smoking status: Former    Types: Cigarettes    Quit date: 2002    Years since quitting: 22.0   Smokeless tobacco: Never   Tobacco comments:    quit 2002  Vaping Use   Vaping Use: Never used  Substance and Sexual Activity   Alcohol use: Yes    Alcohol/week: 0.0 standard drinks of alcohol    Comment: 6-8 beers   Drug use: Never   Sexual activity: Not on file  Other Topics Concern   Not on file  Social History Narrative   Married.   Travels to and from [place] often- son still lives there.   Social Determinants of Health   Financial Resource Strain: Low Risk  (11/17/2021)   Overall Financial Resource Strain (CARDIA)    Difficulty of Paying Living Expenses: Not hard at all  Food Insecurity: No Food Insecurity (11/17/2021)   Hunger Vital Sign    Worried About Running Out of Food in the Last Year: Never true    Ran Out of Food in the Last Year: Never true  Transportation Needs: No Transportation Needs (11/17/2021)   PRAPARE - Hydrologist (Medical): No    Lack of Transportation (Non-Medical):  No  Physical Activity: Inactive (11/17/2021)   Exercise Vital Sign    Days of Exercise per Week: 0 days    Minutes of Exercise per Session: 0 min  Stress: No Stress Concern Present (11/17/2021)   [place]    Feeling of Stress : Not at all  Social Connections: Moderately Isolated (11/17/2021)   Social Connection and Isolation Panel [NHANES]    Frequency of Communication with Friends and Family: More than three times a week    Frequency of Social Gatherings with Friends and Family: More than three times a week    Attends Religious Services: Never    Marine scientist or Organizations: No     Attends Archivist Meetings: Never    Marital Status: Married  Human resources officer Violence: Not At Risk (11/17/2021)   Humiliation, Afraid, Rape, and Kick questionnaire    Fear of Current or Ex-Partner: No    Emotionally Abused: No    Physically Abused: No    Sexually Abused: No    Past Surgical History:  Procedure Laterality Date   CATARACT EXTRACTION W/PHACO Left 02/19/2019   Procedure: CATARACT EXTRACTION PHACO AND INTRAOCULAR LENS PLACEMENT ([place])  LEFT;  Surgeon: Leandrew Koyanagi, MD;  Location: [place];  Service: Ophthalmology;  Laterality: Left;   COLONOSCOPY WITH PROPOFOL N/A 10/28/2020   Procedure: COLONOSCOPY WITH PROPOFOL;  Surgeon: Virgel Manifold, MD;  Location: [facility] ENDOSCOPY;  Service: Endoscopy;  Laterality: N/A;   COLONOSCOPY WITH PROPOFOL N/A 11/04/2021   Procedure: COLONOSCOPY WITH PROPOFOL;  Surgeon: Jonathon Bellows, MD;  Location: [facility] ENDOSCOPY;  Service: Gastroenterology;  Laterality: N/A;   EYE SURGERY     JOINT REPLACEMENT     left knee surgeries  1983 1993, 2008   TOTAL HIP ARTHROPLASTY Left 05/19/2020    Family History  Problem Relation Age of Onset   Heart disease Father    Cancer Sister 58       colon   Heart disease Brother     No Known Allergies  Current Outpatient Medications on File Prior to Visit  Medication Sig Dispense Refill   allopurinol (ZYLOPRIM) 300 MG tablet TAKE 1 TABLET(300 MG) BY MOUTH DAILY FOR GOUT PREVENTION 90 tablet 0   aspirin 81 MG tablet Take 81 mg by mouth daily.     atorvastatin (LIPITOR) 20 MG tablet Take 1 tablet (20 mg total) by mouth daily. For cholesterol. 90 tablet 3   Garlic 9735 MG CAPS Take 2 capsules by mouth.     metFORMIN (GLUCOPHAGE-XR) 500 MG 24 hr tablet TAKE 1 TABLET(500 MG) BY MOUTH DAILY WITH BREAKFAST FOR DIABETES 90 tablet 0   Multiple Vitamin (MULTIVITAMIN) tablet Take 1 tablet by mouth daily.     Omega-3 Fatty Acids (FISH OIL) 1000 MG CAPS Take by mouth.     sildenafil  (REVATIO) 20 MG tablet TAKE 2 TO 4 TABLETS BY MOUTH 1 HOUR BEFORE SEXUAL ACTIVITY 50 tablet 0   timolol (BETIMOL) 0.5 % ophthalmic solution 1 drop at bedtime.     valsartan-hydrochlorothiazide (DIOVAN-HCT) 160-12.5 MG tablet Take 1 tablet by mouth daily. For blood pressure. 90 tablet 3   amoxicillin (AMOXIL) 500 MG tablet Take 2,000 mg by mouth as directed. Prior to dental procedures (Patient not taking: Reported on 11/17/2021)     No current facility-administered medications on file prior to visit.    BP (!) 152/84   Pulse 65   Temp 97.9 F (36.6 C) (Temporal)  Ht '5\' 9"'$  (1.753 m)   Wt 187 lb (84.8 kg)   SpO2 98%   BMI 27.62 kg/m  Objective:   Physical Exam Cardiovascular:     Rate and Rhythm: Normal rate and regular rhythm.  Pulmonary:     Effort: Pulmonary effort is normal.     Breath sounds: Normal breath sounds. No wheezing or rales.  Abdominal:     General: Bowel sounds are normal.     Palpations: Abdomen is soft.     Tenderness: There is no abdominal tenderness.  Musculoskeletal:     Cervical back: Neck supple.  Skin:    General: Skin is warm and dry.  Neurological:     Mental Status: He is alert and oriented to person, place, and time.  Psychiatric:        Mood and Affect: Mood normal.           Assessment & Plan:  Primary hypertension Assessment & Plan: Readings slightly elevated today.   BP readings at home are at goal.   Continue Valsartan-HCTZ 160-12.5 mg by mouth once daily. Discussed to continue checking BP at home.  CMP pending.  I evaluated patient, was consulted regarding treatment, and agree with assessment and plan per Tinnie Gens, RN, DNP student.   Allie Bossier, NP-C   Orders: -     Comprehensive metabolic panel  Hyperlipidemia, unspecified hyperlipidemia type Assessment & Plan: Controlled.   Continue Atorvastatin 20 mg by mouth daily.   Repeat lipid panel pending.  I evaluated patient, was consulted regarding treatment, and  agree with assessment and plan per Tinnie Gens, RN, DNP student.   Allie Bossier, NP-C   Orders: -     Lipid panel  Type 2 diabetes mellitus with hyperglycemia, without long-term current use of insulin (HCC) Assessment & Plan: Hemoglobin A1C pending.   Continue Metformin XR 500 mg daily.   Foot exam up to date. Eye exam up to date. Pneumonia vaccine up to date. Managed on statin and ARB.   Urine ACR pending.   Follow up in 6 months.  I evaluated patient, was consulted regarding treatment, and agree with assessment and plan per Tinnie Gens, RN, DNP student.   Allie Bossier, NP-C   Orders: -     Microalbumin / creatinine urine ratio -     Hemoglobin A1c  Other secondary gout of foot, unspecified chronicity, unspecified laterality Assessment & Plan: Controlled. No recent flare ups.   Continue Allopurinol 300 mg.  Uric acid level pending.  I evaluated patient, was consulted regarding treatment, and agree with assessment and plan per Tinnie Gens, RN, DNP student.   Allie Bossier, NP-C   Orders: -     Uric acid  Elevated PSA Assessment & Plan: Repeat PSA level pending.   Screening for prostate cancer -     PSA, Medicare  Erectile dysfunction, unspecified erectile dysfunction type Assessment & Plan: Controlled.  Continue sildenafil 20 mg PRN.    Family history of colon cancer Assessment & Plan: Colonoscopy UTD, reviewed today. Due for recall in 2026. Discussed with patient.          Pleas Koch, NP

## 2022-10-10 NOTE — Progress Notes (Signed)
Established Patient Office Visit  Subjective   Patient ID: David Turner, male    DOB: 1950-11-11  Age: 72 y.o. MRN: [id number]  Chief Complaint  Patient presents with   Medical Management of Chronic Issues    Annual follow up diabetes    HPI  David Turner is a 72 year old male with past medical history of hypertension, gout, type 2 diabetes, hyperlipidemia who presents today for follow up.   for complete physical and follow up of chronic conditions.  Immunizations: -Tetanus: Completed in 2019 -Influenza: Completed this season -Shingles: Completed Shingrix series -Pneumonia: Completed in 2020  Diet: [place].  Exercise: No regular exercise. Stays active.   Eye exam: Completes twice a year  Dental exam: Completes three time a year.   Colonoscopy: Completed in 2023. Due in 2026.   PSA: Due  Current medications include: Metformin XR 500 mg once daily.   He is not checking blood sugars at home.   Last A1C: 5.7 in July, 2023.  Last Eye Exam: Completed Last Foot Exam: up to date Pneumonia Vaccination: 2020 Urine Microalbumin: Due Statin: Atorvastatin  Dietary changes since last visit: no changes from before.   Exercise: no regular exercise but stays active.   Gout: Currently managed on Allopurinol 300 mg daily. No recent flare ups.   Hypertension: Currently managed with Valsartan-HCTZ 160-12.5 mg once daily. Reports his readings at home have been between 120s-80s. Denies any blurred vision, chest pain, headaches, shortness of breath or lower extremity edema.   He does report numbness in 2nd and third digits of his left hand.  After stretching the numbness is resolved. Denies any trauma or injury. Does not affect daily activities.   Patient Active Problem List   Diagnosis Date Noted   Elevated PSA 10/27/2021   Ear fullness, bilateral 09/29/2021   Chronic back pain 01/25/2021   Polyp of transverse colon    Polyp of sigmoid colon    Type 2 diabetes mellitus with  hyperglycemia ([place]) 10/26/2020   Family history of colon cancer 10/07/2020   Cervical radiculopathy 04/11/2018   ED (erectile dysfunction) 11/26/2017   Allergic rhinitis 11/26/2017   Gout    Hypertension    Hyperlipidemia    Past Medical History:  Diagnosis Date   Diabetes mellitus without complication ([place])    Gout    Hyperlipidemia    Hypertension    Wears dentures    partials, upper and lower   Past Surgical History:  Procedure Laterality Date   CATARACT EXTRACTION W/PHACO Left 02/19/2019   Procedure: CATARACT EXTRACTION PHACO AND INTRAOCULAR LENS PLACEMENT ([place])  LEFT;  Surgeon: Leandrew Koyanagi, MD;  Location: [place];  Service: Ophthalmology;  Laterality: Left;   COLONOSCOPY WITH PROPOFOL N/A 10/28/2020   Procedure: COLONOSCOPY WITH PROPOFOL;  Surgeon: Virgel Manifold, MD;  Location: [facility] ENDOSCOPY;  Service: Endoscopy;  Laterality: N/A;   COLONOSCOPY WITH PROPOFOL N/A 11/04/2021   Procedure: COLONOSCOPY WITH PROPOFOL;  Surgeon: Jonathon Bellows, MD;  Location: [facility] ENDOSCOPY;  Service: Gastroenterology;  Laterality: N/A;   EYE SURGERY     JOINT REPLACEMENT     left knee surgeries  1983 1993, 2008   TOTAL HIP ARTHROPLASTY Left 05/19/2020   Social History   Tobacco Use   Smoking status: Former    Types: Cigarettes    Quit date: 2002    Years since quitting: 22.0   Smokeless tobacco: Never   Tobacco comments:    quit 2002  Vaping Use   Vaping  Use: Never used  Substance Use Topics   Alcohol use: Yes    Alcohol/week: 0.0 standard drinks of alcohol    Comment: 6-8 beers   Drug use: Never   Family History  Problem Relation Age of Onset   Heart disease Father    Cancer Sister 90       colon   Heart disease Brother    No Known Allergies    Review of Systems  Constitutional:  Negative for chills and fever.  Eyes:  Negative for blurred vision.  Respiratory:  Negative for shortness of breath.   Cardiovascular:  Negative for chest pain.   Gastrointestinal:  Negative for constipation, diarrhea and nausea.  Musculoskeletal:  Negative for myalgias.  Neurological:  Negative for dizziness, tingling and headaches.  Psychiatric/Behavioral:  The patient is not nervous/anxious.       Objective:     BP (!) 152/84   Pulse 65   Temp 97.9 F (36.6 C) (Temporal)   Ht '5\' 9"'$  (1.753 m)   Wt 187 lb (84.8 kg)   SpO2 98%   BMI 27.62 kg/m  BP Readings from Last 3 Encounters:  10/10/22 (!) 152/84  04/04/22 140/78  11/04/21 (!) 148/94   Wt Readings from Last 3 Encounters:  10/10/22 187 lb (84.8 kg)  04/04/22 180 lb (81.6 kg)  11/17/21 180 lb (81.6 kg)      Physical Exam Vitals and nursing note reviewed.  Constitutional:      Appearance: Normal appearance.  Cardiovascular:     Rate and Rhythm: Normal rate and regular rhythm.     Pulses: Normal pulses.     Heart sounds: Normal heart sounds.  Pulmonary:     Effort: Pulmonary effort is normal.     Breath sounds: Normal breath sounds.  Abdominal:     General: Bowel sounds are normal.  Musculoskeletal:        General: Normal range of motion.     Comments: Full ROM in both hands.  Skin:    General: Skin is warm and dry.     Capillary Refill: Capillary refill takes less than 2 seconds.  Neurological:     Mental Status: He is alert and oriented to person, place, and time.  Psychiatric:        Mood and Affect: Mood normal.        Behavior: Behavior normal.     No results found for any visits on 10/10/22.     The 10-year ASCVD risk score (Arnett DK, et al., 2019) is: 39.3%    Assessment & Plan:   Problem List Items Addressed This Visit       Cardiovascular and Mediastinum   Hypertension - Primary    Readings slightly elevated today.   BP readings at home are at goal.   Continue Valsartan-HCTZ 160-12.5 mg by mouth once daily. Discussed to continue checking BP at home.       Relevant Orders   Comprehensive metabolic panel     Endocrine   Type 2  diabetes mellitus with hyperglycemia (HCC)    Hemoglobin A1C pending.   Continue Metformin XR 500 mg daily.   Foot exam up to date. Eye exam up to date. Pneumonia vaccine up to date. Managed on statin and ARB.   Urine ACR pending.   Follow up in 6 months.      Relevant Orders   Microalbumin/Creatinine Ratio, Urine   Hemoglobin A1c     Other   Gout    Controlled.  No recent flare ups.   Continue Allopurinol 300 mg.  Uric acid level pending.      Relevant Orders   Uric acid   Hyperlipidemia    Controlled.   Continue Atorvastatin 20 mg by mouth daily.   Repeat lipid panel pending.      Relevant Orders   Lipid panel   Elevated PSA   Other Visit Diagnoses     Screening for prostate cancer       Relevant Orders   PSA, Medicare       Return in about 6 months (around 04/10/2023) for diabetes .    Tinnie Gens, BSN-RN, DNP STUDENT

## 2022-10-10 NOTE — Assessment & Plan Note (Addendum)
Readings slightly elevated today.   BP readings at home are at goal.   Continue Valsartan-HCTZ 160-12.5 mg by mouth once daily. Discussed to continue checking BP at home.  CMP pending.  I evaluated patient, was consulted regarding treatment, and agree with assessment and plan per Tinnie Gens, RN, DNP student.   Allie Bossier, NP-C

## 2022-10-10 NOTE — Assessment & Plan Note (Signed)
Repeat PSA level pending.

## 2022-10-10 NOTE — Assessment & Plan Note (Addendum)
Hemoglobin A1C pending.   Continue Metformin XR 500 mg daily.   Foot exam up to date. Eye exam up to date. Pneumonia vaccine up to date. Managed on statin and ARB.   Urine ACR pending.   Follow up in 6 months.  I evaluated patient, was consulted regarding treatment, and agree with assessment and plan per Tinnie Gens, RN, DNP student.   Allie Bossier, NP-C

## 2022-10-10 NOTE — Assessment & Plan Note (Signed)
Colonoscopy UTD, reviewed today. Due for recall in 2026. Discussed with patient.

## 2022-10-10 NOTE — Assessment & Plan Note (Addendum)
Controlled.   Continue Atorvastatin 20 mg by mouth daily.   Repeat lipid panel pending.  I evaluated patient, was consulted regarding treatment, and agree with assessment and plan per Tinnie Gens, RN, DNP student.   Allie Bossier, NP-C

## 2022-10-11 ENCOUNTER — Other Ambulatory Visit: Payer: Self-pay | Admitting: Primary Care

## 2022-10-11 ENCOUNTER — Telehealth: Payer: Self-pay | Admitting: Primary Care

## 2022-10-11 DIAGNOSIS — R972 Elevated prostate specific antigen [PSA]: Secondary | ICD-10-CM

## 2022-10-11 DIAGNOSIS — I10 Essential (primary) hypertension: Secondary | ICD-10-CM

## 2022-10-11 DIAGNOSIS — R3912 Poor urinary stream: Secondary | ICD-10-CM

## 2022-10-11 MED ORDER — TAMSULOSIN HCL 0.4 MG PO CAPS: 0.4000 mg | ORAL_CAPSULE | Freq: Every day | ORAL | 0 refills | Status: DC

## 2022-10-11 NOTE — Telephone Encounter (Signed)
See result note for further documentation.

## 2022-10-11 NOTE — Telephone Encounter (Signed)
Patient returned call regarding lab results,would like a cb.  

## 2022-10-12 ENCOUNTER — Other Ambulatory Visit: Payer: Self-pay | Admitting: Primary Care

## 2022-10-12 DIAGNOSIS — E785 Hyperlipidemia, unspecified: Secondary | ICD-10-CM

## 2022-10-24 DIAGNOSIS — H40003 Preglaucoma, unspecified, bilateral: Secondary | ICD-10-CM | POA: Diagnosis not present

## 2022-11-02 ENCOUNTER — Telehealth: Payer: Self-pay | Admitting: Primary Care

## 2022-11-02 NOTE — Telephone Encounter (Signed)
Contacted David Turner to schedule their annual wellness visit. Appointment made for 12/06/2022.   [place] Direct Dial: [phone]

## 2022-11-06 DIAGNOSIS — H40003 Preglaucoma, unspecified, bilateral: Secondary | ICD-10-CM | POA: Diagnosis not present

## 2022-11-06 DIAGNOSIS — E119 Type 2 diabetes mellitus without complications: Secondary | ICD-10-CM | POA: Diagnosis not present

## 2022-11-06 DIAGNOSIS — Z961 Presence of intraocular lens: Secondary | ICD-10-CM | POA: Diagnosis not present

## 2022-11-06 DIAGNOSIS — D3101 Benign neoplasm of right conjunctiva: Secondary | ICD-10-CM | POA: Diagnosis not present

## 2022-11-22 ENCOUNTER — Other Ambulatory Visit: Payer: Self-pay | Admitting: Primary Care

## 2022-11-22 DIAGNOSIS — E119 Type 2 diabetes mellitus without complications: Secondary | ICD-10-CM

## 2022-12-06 ENCOUNTER — Ambulatory Visit (INDEPENDENT_AMBULATORY_CARE_PROVIDER_SITE_OTHER): Payer: Medicare Other

## 2022-12-06 VITALS — Ht 69.0 in | Wt 180.0 lb

## 2022-12-06 DIAGNOSIS — Z0000 Encounter for general adult medical examination without abnormal findings: Secondary | ICD-10-CM | POA: Diagnosis not present

## 2022-12-06 NOTE — Progress Notes (Signed)
I connected with  David Turner on 12/06/22 by a audio enabled telemedicine application and verified that I am speaking with the correct person using two identifiers.  Patient Location: Home  Provider Location: Office/Clinic  I discussed the limitations of evaluation and management by telemedicine. The patient expressed understanding and agreed to proceed.  Subjective:   David Turner is a 72 y.o. male who presents for Medicare Annual/Subsequent preventive examination.  Review of Systems      Cardiac Risk Factors include: advanced age (>2men, >34 women)     Objective:    Today's Vitals   12/06/22 1248  Weight: 180 lb (81.6 kg)  Height: 5\' 9"  (1.753 m)   Body mass index is 26.58 kg/m.     12/06/2022   12:57 PM 11/17/2021    2:00 PM 11/04/2021    6:50 AM 10/28/2020    7:28 AM 02/19/2019    9:59 AM 05/07/2018    4:14 PM 01/16/2018    9:18 AM  Advanced Directives  Does Patient Have a Medical Advance Directive? No No No No No No No  Would patient like information on creating a medical advance directive? No - Patient declined Yes (MAU/Ambulatory/Procedural Areas - Information given)  No - Patient declined No - Patient declined No - Patient declined Yes (MAU/Ambulatory/Procedural Areas - Information given)    Current Medications (verified) Outpatient Encounter Medications as of 12/06/2022  Medication Sig   allopurinol (ZYLOPRIM) 300 MG tablet TAKE 1 TABLET(300 MG) BY MOUTH DAILY FOR GOUT PREVENTION   aspirin 81 MG tablet Take 81 mg by mouth daily.   atorvastatin (LIPITOR) 20 MG tablet TAKE 1 TABLET(20 MG) BY MOUTH DAILY FOR CHOLESTEROL   Garlic 123XX123 MG CAPS Take 2 capsules by mouth.   metFORMIN (GLUCOPHAGE-XR) 500 MG 24 hr tablet TAKE 1 TABLET(500 MG) BY MOUTH DAILY WITH BREAKFAST FOR DIABETES   Multiple Vitamin (MULTIVITAMIN) tablet Take 1 tablet by mouth daily.   Omega-3 Fatty Acids (FISH OIL) 1000 MG CAPS Take by mouth.   sildenafil (REVATIO) 20 MG tablet TAKE 2 TO 4 TABLETS BY  MOUTH 1 HOUR BEFORE SEXUAL ACTIVITY   tamsulosin (FLOMAX) 0.4 MG CAPS capsule Take 1 capsule (0.4 mg total) by mouth daily after supper. For urine flow   timolol (BETIMOL) 0.5 % ophthalmic solution 1 drop at bedtime.   valsartan-hydrochlorothiazide (DIOVAN-HCT) 160-12.5 MG tablet TAKE 1 TABLET BY MOUTH DAILY FOR BLOOD PRESSURE   amoxicillin (AMOXIL) 500 MG tablet Take 2,000 mg by mouth as directed. Prior to dental procedures (Patient not taking: Reported on 11/17/2021)   No facility-administered encounter medications on file as of 12/06/2022.    Allergies (verified) Patient has no known allergies.   History: Past Medical History:  Diagnosis Date   Diabetes mellitus without complication ([place])    Ear fullness, bilateral 09/29/2021   Gout    Hyperlipidemia    Hypertension    Wears dentures    partials, upper and lower   Past Surgical History:  Procedure Laterality Date   CATARACT EXTRACTION W/PHACO Left 02/19/2019   Procedure: CATARACT EXTRACTION PHACO AND INTRAOCULAR LENS PLACEMENT ([place])  LEFT;  Surgeon: Leandrew Koyanagi, MD;  Location: [facility];  Service: Ophthalmology;  Laterality: Left;   COLONOSCOPY WITH PROPOFOL N/A 10/28/2020   Procedure: COLONOSCOPY WITH PROPOFOL;  Surgeon: Virgel Manifold, MD;  Location: [facility];  Service: Endoscopy;  Laterality: N/A;   COLONOSCOPY WITH PROPOFOL N/A 11/04/2021   Procedure: COLONOSCOPY WITH PROPOFOL;  Surgeon: Jonathon Bellows, MD;  Location: [facility];  Service: Gastroenterology;  Laterality: N/A;   EYE SURGERY     JOINT REPLACEMENT     left knee surgeries  1983 1993, 2008   TOTAL HIP ARTHROPLASTY Left 05/19/2020   Family History  Problem Relation Age of Onset   Heart disease Father    Cancer Sister 56       colon   Heart disease Brother    Social History   Socioeconomic History   Marital status: Married    Spouse name: Not on file   Number of children: Not on file   Years of education: Not on file    Highest education level: Not on file  Occupational History   Not on file  Tobacco Use   Smoking status: Former    Types: Cigarettes    Quit date: 2002    Years since quitting: 22.2   Smokeless tobacco: Never   Tobacco comments:    quit 2002  Vaping Use   Vaping Use: Never used  Substance and Sexual Activity   Alcohol use: Yes    Alcohol/week: 0.0 standard drinks of alcohol    Comment: 6-8 beers   Drug use: Never   Sexual activity: Not on file  Other Topics Concern   Not on file  Social History Narrative   Married.   Travels to and from [place] often- son still lives there.   Social Determinants of Health   Financial Resource Strain: Low Risk  (12/06/2022)   Overall Financial Resource Strain (CARDIA)    Difficulty of Paying Living Expenses: Not hard at all  Food Insecurity: No Food Insecurity (12/06/2022)   Hunger Vital Sign    Worried About Running Out of Food in the Last Year: Never true    Ran Out of Food in the Last Year: Never true  Transportation Needs: No Transportation Needs (12/06/2022)   PRAPARE - Hydrologist (Medical): No    Lack of Transportation (Non-Medical): No  Physical Activity: Inactive (12/06/2022)   Exercise Vital Sign    Days of Exercise per Week: 0 days    Minutes of Exercise per Session: 0 min  Stress: No Stress Concern Present (12/06/2022)   [place]    Feeling of Stress : Not at all  Social Connections: Moderately Isolated (12/06/2022)   Social Connection and Isolation Panel [NHANES]    Frequency of Communication with Friends and Family: More than three times a week    Frequency of Social Gatherings with Friends and Family: Never    Attends Religious Services: Never    Marine scientist or Organizations: No    Attends Music therapist: Never    Marital Status: Married    Tobacco Counseling Counseling given: Not Answered Tobacco  comments: quit 2002   Clinical Intake:  Pre-visit preparation completed: Yes  Pain : No/denies pain     Nutritional Risks: None Diabetes: Yes CBG done?: No Did pt. bring in CBG monitor from home?: No  How often do you need to have someone help you when you read instructions, pamphlets, or other written materials from your doctor or pharmacy?: 1 - Never  Diabetic?Nutrition Risk Assessment:  Has the patient had any N/V/D within the last 2 months?  No  Does the patient have any non-healing wounds?  No  Has the patient had any unintentional weight loss or weight gain?  No   Diabetes:  Is the patient diabetic?  Yes  If  diabetic, was a CBG obtained today?  No  Did the patient bring in their glucometer from home?  No  How often do you monitor your CBG's? Does not checked.   Financial Strains and Diabetes Management:  Are you having any financial strains with the device, your supplies or your medication? No .  Does the patient want to be seen by Chronic Care Management for management of their diabetes?  No  Would the patient like to be referred to a Nutritionist or for Diabetic Management?  No   Diabetic Exams:  Diabetic Eye Exam: Completed 10/25/21 [facility] Diabetic Foot Exam: Completed 04/02/22 PCP    Interpreter Needed?: No  Information entered by :: C. LPN   Activities of Daily Living    12/06/2022   12:57 PM 10/10/2022   11:37 AM  In your present state of health, do you have any difficulty performing the following activities:  Hearing? 0 0  Vision? 0 0  Difficulty concentrating or making decisions? 0 0  Walking or climbing stairs? 0 0  Dressing or bathing? 0 0  Doing errands, shopping? 0 0  Preparing Food and eating ? N   Using the Toilet? N   In the past six months, have you accidently leaked urine? N   Do you have problems with loss of bowel control? N   Managing your Medications? N   Managing your Finances? N   Housekeeping or managing your  Housekeeping? N     Patient Care Team: Pleas Koch, NP as PCP - General (Internal Medicine) Lovell Sheehan, MD as Consulting Physician (Orthopedic Surgery)  Indicate any recent Medical Services you may have received from other than [facility] providers in the past year (date may be approximate).     Assessment:   This is a routine wellness examination for David Turner.  Hearing/Vision screen Hearing Screening - Comments:: No aids Vision Screening - Comments:: No glasses  Dietary issues and exercise activities discussed: Current Exercise Habits: The patient does not participate in regular exercise at present, Exercise limited by: None identified   Goals Addressed             This Visit's Progress    Patient Stated       No goal other than to stay healthy.     Patient Stated       No new goals.       Depression Screen    12/06/2022   12:56 PM 10/10/2022   11:37 AM 11/17/2021    2:04 PM 10/27/2021   10:22 AM 10/26/2020   11:34 AM 09/30/2019    9:58 AM 01/16/2018    9:21 AM  PHQ 2/9 Scores  PHQ - 2 Score 0 0 0 0 0 0 0  PHQ- 9 Score    0 0      Fall Risk    12/06/2022   12:49 PM 10/10/2022   11:36 AM 11/17/2021    2:02 PM 10/27/2021   10:23 AM 10/26/2020   11:34 AM  Fall Risk   Falls in the past year? 0 0 0 0 1  Number falls in past yr: 0 0 0 0 0  Injury with Fall? 0 0 0 0 0  Risk for fall due to : No Fall Risks No Fall Risks No Fall Risks    Follow up Falls prevention discussed;Falls evaluation completed Falls evaluation completed Falls prevention discussed      FALL RISK PREVENTION PERTAINING TO THE HOME:  Any stairs in  or around the home? Yes  If so, are there any without handrails? No  Home free of loose throw rugs in walkways, pet beds, electrical cords, etc? Yes  Adequate lighting in your home to reduce risk of falls? Yes   ASSISTIVE DEVICES UTILIZED TO PREVENT FALLS:  Life alert? No  Use of a cane, walker or w/c? No  Grab bars in the bathroom? Yes  Shower  chair or bench in shower? Yes  Elevated toilet seat or a handicapped toilet? Yes    Cognitive Function:        12/06/2022   12:58 PM  6CIT Screen  What Year? 0 points  What month? 0 points  What time? 0 points  Count back from 20 0 points  Months in reverse 0 points  Repeat phrase 0 points  Total Score 0 points    Immunizations Immunization History  Administered Date(s) Administered   Fluad Quad(high Dose 65+) 06/19/2019, 06/16/2020, 07/15/2021, 06/01/2022   PFIZER(Purple Top)SARS-COV-2 Vaccination 10/26/2019, 11/25/2019, 06/23/2020   Pfizer Covid-19 Vaccine Bivalent Booster 80yrs & up 07/04/2021   Pneumococcal Conjugate-13 11/26/2017   Pneumococcal Polysaccharide-23 06/19/2019   Tdap 11/26/2017   Zoster Recombinat (Shingrix) 07/08/2019, 09/09/2019   Zoster, Live 11/05/2013    TDAP status: Up to date  Flu Vaccine status: Up to date  Pneumococcal vaccine status: Up to date  Covid-19 vaccine status: Information provided on how to obtain vaccines.   Qualifies for Shingles Vaccine? Yes   Zostavax completed Yes   Shingrix Completed?: Yes  Screening Tests Health Maintenance  Topic Date Due   OPHTHALMOLOGY EXAM  10/25/2022   FOOT EXAM  04/05/2023   HEMOGLOBIN A1C  04/10/2023   Diabetic kidney evaluation - eGFR measurement  10/11/2023   Diabetic kidney evaluation - Urine ACR  10/11/2023   Medicare Annual Wellness (AWV)  12/06/2023   COLONOSCOPY (Pts 45-87yrs Insurance coverage will need to be confirmed)  11/04/2024   DTaP/Tdap/Td (2 - Td or Tdap) 11/27/2027   Pneumonia Vaccine 25+ Years old  Completed   INFLUENZA VACCINE  Completed   Hepatitis C Screening  Completed   Zoster Vaccines- Shingrix  Completed   HPV VACCINES  Aged Out   COVID-19 Vaccine  Discontinued    Health Maintenance  Health Maintenance Due  Topic Date Due   OPHTHALMOLOGY EXAM  10/25/2022    Colorectal cancer screening: Type of screening: Colonoscopy. Completed 11/04/21. Repeat every 3  years  Lung Cancer Screening: (Low Dose CT Chest recommended if Age 36-80 years, 30 pack-year currently smoking OR have quit w/in 15years.) does not qualify.   Lung Cancer Screening Referral: no  Additional Screening:  Hepatitis C Screening: does qualify; Completed 09/29/15  Vision Screening: Recommended annual ophthalmology exams for early detection of glaucoma and other disorders of the eye. Is the patient up to date with their annual eye exam?  Yes  Who is the provider or what is the name of the office in which the patient attends annual eye exams? [facility] If pt is not established with a provider, would they like to be referred to a provider to establish care? No .   Dental Screening: Recommended annual dental exams for proper oral hygiene  Community Resource Referral / Chronic Care Management: CRR required this visit?  No   CCM required this visit?  No      Plan:     I have personally reviewed and noted the following in the patient's chart:   Medical and social history Use of alcohol,  tobacco or illicit drugs  Current medications and supplements including opioid prescriptions. Patient is not currently taking opioid prescriptions. Functional ability and status Nutritional status Physical activity Advanced directives List of other physicians Hospitalizations, surgeries, and ER visits in previous 12 months Vitals Screenings to include cognitive, depression, and falls Referrals and appointments  In addition, I have reviewed and discussed with patient certain preventive protocols, quality metrics, and best practice recommendations. A written personalized care plan for preventive services as well as general preventive health recommendations were provided to patient.     Lebron Conners, LPN   [id number]   Nurse Notes: none

## 2022-12-06 NOTE — Patient Instructions (Signed)
David Turner , Thank you for taking time to come for your Medicare Wellness Visit. I appreciate your ongoing commitment to your health goals. Please review the following plan we discussed and let me know if I can assist you in the future.   These are the goals we discussed:  Goals      Increase physical activity     Walk with wife 2x/ week     Patient Stated     Would like to drink more water and eat healthier      Patient Stated     No goal other than to stay healthy.     Patient Stated     No new goals.        This is a list of the screening recommended for you and due dates:  Health Maintenance  Topic Date Due   Eye exam for diabetics  10/25/2022   Complete foot exam   04/05/2023   Hemoglobin A1C  04/10/2023   Yearly kidney function blood test for diabetes  10/11/2023   Yearly kidney health urinalysis for diabetes  10/11/2023   Medicare Annual Wellness Visit  12/06/2023   Colon Cancer Screening  11/04/2024   DTaP/Tdap/Td vaccine (2 - Td or Tdap) 11/27/2027   Pneumonia Vaccine  Completed   Flu Shot  Completed   Hepatitis C Screening: USPSTF Recommendation to screen - Ages 15-79 yo.  Completed   Zoster (Shingles) Vaccine  Completed   HPV Vaccine  Aged Out   COVID-19 Vaccine  Discontinued    Advanced directives: Advance directive discussed with you today. Even though you declined this today, please call our office should you change your mind, and we can give you the proper paperwork for you to fill out.   Conditions/risks identified: Aim for 30 minutes of exercise or brisk walking, 6-8 glasses of water, and 5 servings of fruits and vegetables each day.   Next appointment: Follow up in one year for your annual wellness visit. 12/11/23 @ 1pm  telephone visit.  Preventive Care 11 Years and Older, Male  Preventive care refers to lifestyle choices and visits with your health care provider that can promote health and wellness. What does preventive care include? A yearly  physical exam. This is also called an annual well check. Dental exams once or twice a year. Routine eye exams. Ask your health care provider how often you should have your eyes checked. Personal lifestyle choices, including: Daily care of your teeth and gums. Regular physical activity. Eating a healthy diet. Avoiding tobacco and drug use. Limiting alcohol use. Practicing safe sex. Taking low doses of aspirin every day. Taking vitamin and mineral supplements as recommended by your health care provider. What happens during an annual well check? The services and screenings done by your health care provider during your annual well check will depend on your age, overall health, lifestyle risk factors, and family history of disease. Counseling  Your health care provider may ask you questions about your: Alcohol use. Tobacco use. Drug use. Emotional well-being. Home and relationship well-being. Sexual activity. Eating habits. History of falls. Memory and ability to understand (cognition). Work and work Statistician. Screening  You may have the following tests or measurements: Height, weight, and BMI. Blood pressure. Lipid and cholesterol levels. These may be checked every 5 years, or more frequently if you are over 26 years old. Skin check. Lung cancer screening. You may have this screening every year starting at age 11 if you have a 30-pack-year  history of smoking and currently smoke or have quit within the past 15 years. Fecal occult blood test (FOBT) of the stool. You may have this test every year starting at age 82. Flexible sigmoidoscopy or colonoscopy. You may have a sigmoidoscopy every 5 years or a colonoscopy every 10 years starting at age 75. Prostate cancer screening. Recommendations will vary depending on your family history and other risks. Hepatitis C blood test. Hepatitis B blood test. Sexually transmitted disease (STD) testing. Diabetes screening. This is done by  checking your blood sugar (glucose) after you have not eaten for a while (fasting). You may have this done every 1-3 years. Abdominal aortic aneurysm (AAA) screening. You may need this if you are a current or former smoker. Osteoporosis. You may be screened starting at age 62 if you are at high risk. Talk with your health care provider about your test results, treatment options, and if necessary, the need for more tests. Vaccines  Your health care provider may recommend certain vaccines, such as: Influenza vaccine. This is recommended every year. Tetanus, diphtheria, and acellular pertussis (Tdap, Td) vaccine. You may need a Td booster every 10 years. Zoster vaccine. You may need this after age 53. Pneumococcal 13-valent conjugate (PCV13) vaccine. One dose is recommended after age 57. Pneumococcal polysaccharide (PPSV23) vaccine. One dose is recommended after age 76. Talk to your health care provider about which screenings and vaccines you need and how often you need them. This information is not intended to replace advice given to you by your health care provider. Make sure you discuss any questions you have with your health care provider. Document Released: 09/24/2015 Document Revised: 05/17/2016 Document Reviewed: 06/29/2015 Elsevier Interactive Patient Education  2017 [employer] Prevention in the Home Falls can cause injuries. They can happen to people of all ages. There are many things you can do to make your home safe and to help prevent falls. What can I do on the outside of my home? Regularly fix the edges of walkways and driveways and fix any cracks. Remove anything that might make you trip as you walk through a door, such as a raised step or threshold. Trim any bushes or trees on the path to your home. Use bright outdoor lighting. Clear any walking paths of anything that might make someone trip, such as rocks or tools. Regularly check to see if handrails are loose or  broken. Make sure that both sides of any steps have handrails. Any raised decks and porches should have guardrails on the edges. Have any leaves, snow, or ice cleared regularly. Use sand or salt on walking paths during winter. Clean up any spills in your garage right away. This includes oil or grease spills. What can I do in the bathroom? Use night lights. Install grab bars by the toilet and in the tub and shower. Do not use towel bars as grab bars. Use non-skid mats or decals in the tub or shower. If you need to sit down in the shower, use a plastic, non-slip stool. Keep the floor dry. Clean up any water that spills on the floor as soon as it happens. Remove soap buildup in the tub or shower regularly. Attach bath mats securely with double-sided non-slip rug tape. Do not have throw rugs and other things on the floor that can make you trip. What can I do in the bedroom? Use night lights. Make sure that you have a light by your bed that is easy to reach. Do  not use any sheets or blankets that are too big for your bed. They should not hang down onto the floor. Have a firm chair that has side arms. You can use this for support while you get dressed. Do not have throw rugs and other things on the floor that can make you trip. What can I do in the kitchen? Clean up any spills right away. Avoid walking on wet floors. Keep items that you use a lot in easy-to-reach places. If you need to reach something above you, use a strong step stool that has a grab bar. Keep electrical cords out of the way. Do not use floor polish or wax that makes floors slippery. If you must use wax, use non-skid floor wax. Do not have throw rugs and other things on the floor that can make you trip. What can I do with my stairs? Do not leave any items on the stairs. Make sure that there are handrails on both sides of the stairs and use them. Fix handrails that are broken or loose. Make sure that handrails are as long as  the stairways. Check any carpeting to make sure that it is firmly attached to the stairs. Fix any carpet that is loose or worn. Avoid having throw rugs at the top or bottom of the stairs. If you do have throw rugs, attach them to the floor with carpet tape. Make sure that you have a light switch at the top of the stairs and the bottom of the stairs. If you do not have them, ask someone to add them for you. What else can I do to help prevent falls? Wear shoes that: Do not have high heels. Have rubber bottoms. Are comfortable and fit you well. Are closed at the toe. Do not wear sandals. If you use a stepladder: Make sure that it is fully opened. Do not climb a closed stepladder. Make sure that both sides of the stepladder are locked into place. Ask someone to hold it for you, if possible. Clearly mark and make sure that you can see: Any grab bars or handrails. First and last steps. Where the edge of each step is. Use tools that help you move around (mobility aids) if they are needed. These include: Canes. Walkers. Scooters. Crutches. Turn on the lights when you go into a dark area. Replace any light bulbs as soon as they burn out. Set up your furniture so you have a clear path. Avoid moving your furniture around. If any of your floors are uneven, fix them. If there are any pets around you, be aware of where they are. Review your medicines with your doctor. Some medicines can make you feel dizzy. This can increase your chance of falling. Ask your doctor what other things that you can do to help prevent falls. This information is not intended to replace advice given to you by your health care provider. Make sure you discuss any questions you have with your health care provider. Document Released: 06/24/2009 Document Revised: 02/03/2016 Document Reviewed: 10/02/2014 Elsevier Interactive Patient Education  2017 [employer].

## 2023-01-09 ENCOUNTER — Other Ambulatory Visit: Payer: Self-pay | Admitting: Primary Care

## 2023-01-09 ENCOUNTER — Other Ambulatory Visit (INDEPENDENT_AMBULATORY_CARE_PROVIDER_SITE_OTHER): Payer: Medicare Other

## 2023-01-09 DIAGNOSIS — R972 Elevated prostate specific antigen [PSA]: Secondary | ICD-10-CM

## 2023-01-09 DIAGNOSIS — R3912 Poor urinary stream: Secondary | ICD-10-CM

## 2023-01-09 LAB — PSA: PSA: 5.96 ng/mL — ABNORMAL HIGH (ref 0.10–4.00)

## 2023-01-09 MED ORDER — TAMSULOSIN HCL 0.4 MG PO CAPS: 0.8000 mg | ORAL_CAPSULE | Freq: Every day | ORAL | 0 refills | Status: DC

## 2023-02-02 ENCOUNTER — Ambulatory Visit: Payer: Medicare Other | Admitting: Urology

## 2023-03-05 ENCOUNTER — Ambulatory Visit (INDEPENDENT_AMBULATORY_CARE_PROVIDER_SITE_OTHER): Payer: Medicare Other | Admitting: Urology

## 2023-03-05 ENCOUNTER — Encounter: Payer: Self-pay | Admitting: Urology

## 2023-03-05 VITALS — BP 171/84 | HR 68 | Ht 69.0 in | Wt 180.0 lb

## 2023-03-05 DIAGNOSIS — R972 Elevated prostate specific antigen [PSA]: Secondary | ICD-10-CM

## 2023-03-05 NOTE — Progress Notes (Signed)
I, David Turner,acting as a scribe for David Altes, MD.,have documented all relevant documentation on the behalf of David Altes, MD,as directed by  David Altes, MD while in the presence of David Altes, MD.  03/05/2023 11:14 AM   [name] 1951/08/30 [id number]  Referring provider: Doreene Nest, NP [address] [place],  [place] [address]  Chief Complaint  Patient presents with   Elevated PSA    HPI: David Turner is a 72 y.o. male referred for evaluation of an elevated PSA.  PSA drawn 10/10/22 elevated 6.45; repeated 01/09/23 and was persistently elevated at 5.96 Prior PSA January 2023 was 5.39, and when repeated April 2023 was 3.30 No bothersome lower urinary tract symptoms No family history of prostate cancer.   PSA trend   PSA  Latest Ref Rng 0.10 - 4.00 ng/mL  06/19/2019 2.34   10/07/2020 2.24   09/29/2021 5.39 (H)   12/30/2021 3.30   10/10/2022 6.45 (H)   01/09/2023 5.96 (H)       PMH: Past Medical History:  Diagnosis Date   Diabetes mellitus without complication (HCC)    Ear fullness, bilateral 09/29/2021   Gout    Hyperlipidemia    Hypertension    Wears dentures    partials, upper and lower    Surgical History: Past Surgical History:  Procedure Laterality Date   CATARACT EXTRACTION W/PHACO Left 02/19/2019   Procedure: CATARACT EXTRACTION PHACO AND INTRAOCULAR LENS PLACEMENT (IOC)  LEFT;  Surgeon: Lockie Mola, MD;  Location: [facility];  Service: Ophthalmology;  Laterality: Left;   COLONOSCOPY WITH PROPOFOL N/A 10/28/2020   Procedure: COLONOSCOPY WITH PROPOFOL;  Surgeon: Pasty Spillers, MD;  Location: [facility] ENDOSCOPY;  Service: Endoscopy;  Laterality: N/A;   COLONOSCOPY WITH PROPOFOL N/A 11/04/2021   Procedure: COLONOSCOPY WITH PROPOFOL;  Surgeon: Wyline Mood, MD;  Location: [facility] ENDOSCOPY;  Service: Gastroenterology;  Laterality: N/A;   EYE SURGERY     JOINT REPLACEMENT     left knee surgeries  1983 1993,  2008   TOTAL HIP ARTHROPLASTY Left 05/19/2020    Home Medications:  Allergies as of 03/05/2023   No Known Allergies      Medication List        Accurate as of March 05, 2023 11:14 AM. If you have any questions, ask your nurse or doctor.          STOP taking these medications    amoxicillin 500 MG tablet Commonly known as: AMOXIL Stopped by: David Altes, MD       TAKE these medications    allopurinol 300 MG tablet Commonly known as: ZYLOPRIM TAKE 1 TABLET(300 MG) BY MOUTH DAILY FOR GOUT PREVENTION   aspirin 81 MG tablet Take 81 mg by mouth daily.   atorvastatin 20 MG tablet Commonly known as: LIPITOR TAKE 1 TABLET(20 MG) BY MOUTH DAILY FOR CHOLESTEROL   Fish Oil 1000 MG Caps Take by mouth.   Garlic 1000 MG Caps Take 2 capsules by mouth.   metFORMIN 500 MG 24 hr tablet Commonly known as: GLUCOPHAGE-XR TAKE 1 TABLET(500 MG) BY MOUTH DAILY WITH BREAKFAST FOR DIABETES   multivitamin tablet Take 1 tablet by mouth daily.   sildenafil 20 MG tablet Commonly known as: REVATIO TAKE 2 TO 4 TABLETS BY MOUTH 1 HOUR BEFORE SEXUAL ACTIVITY   tamsulosin 0.4 MG Caps capsule Commonly known as: FLOMAX Take 2 capsules (0.8 mg total) by mouth daily after supper. For urine flow  timolol 0.5 % ophthalmic solution Commonly known as: BETIMOL 1 drop at bedtime.   valsartan-hydrochlorothiazide 160-12.5 MG tablet Commonly known as: DIOVAN-HCT TAKE 1 TABLET BY MOUTH DAILY FOR BLOOD PRESSURE        Allergies: No Known Allergies  Family History: Family History  Problem Relation Age of Onset   Heart disease Father    Cancer Sister 9       colon   Heart disease Brother     Social History:  reports that he quit smoking about 22 years ago. His smoking use included cigarettes. He has never used smokeless tobacco. He reports current alcohol use. He reports that he does not use drugs.   Physical Exam: BP (!) 171/84   Pulse 68   Ht 5\' 9"  (1.753 m)   Wt 180 lb  (81.6 kg)   BMI 26.58 kg/m   Constitutional:  Alert and oriented, No acute distress. HEENT: [place] AT Respiratory: Normal respiratory effort, no increased work of breathing. GU: Prostate 50 grams, smooth without nodules. Psychiatric: Normal mood and affect.   Assessment & Plan:    1. Elevated PSA Benign DRE Although PSA is a prostate cancer screening test he was informed that cancer is not the most common cause of an elevated PSA. Other potential causes including BPH and inflammation were discussed. He was informed that the only way to adequately diagnose prostate cancer would be a transrectal ultrasound and biopsy of the prostate. The procedure was discussed including potential risks of bleeding and infection/sepsis. He was also informed that a negative biopsy does not conclusively rule out the possibility that prostate cancer may be present and that continued monitoring is required. The use of newer adjunctive blood tests including PHI and 4kScore were discussed. The use of multiparametric prostate MRI to evaluate for lesions suspicious for high-grade prostate cancer and aid in targeted biopsy was reviewed. Continued periodic surveillance was also discussed.  After discussing options, he has elected to proceed with prostate MRI; order placed and will call with results.  I have reviewed the above documentation for accuracy and completeness, and I agree with the above.   David Altes, MD  [facility] [address] [place], [place] [address] [phone]

## 2023-03-14 ENCOUNTER — Telehealth: Payer: Self-pay

## 2023-03-14 DIAGNOSIS — M109 Gout, unspecified: Secondary | ICD-10-CM

## 2023-03-14 MED ORDER — ALLOPURINOL 300 MG PO TABS
ORAL_TABLET | ORAL | 1 refills | Status: DC
Start: 2023-03-14 — End: 2023-09-13

## 2023-03-14 NOTE — Telephone Encounter (Signed)
Refills sent to pharmacy. 

## 2023-03-14 NOTE — Addendum Note (Signed)
Addended by: Doreene Nest on: 03/14/2023 03:36 PM   Modules accepted: Orders

## 2023-03-16 ENCOUNTER — Ambulatory Visit
Admission: RE | Admit: 2023-03-16 | Discharge: 2023-03-16 | Disposition: A | Discharge status: Home / Self Care | Payer: Medicare Other | Source: Ambulatory Visit | Attending: Urology | Admitting: Urology

## 2023-03-16 DIAGNOSIS — N4289 Other specified disorders of prostate: Secondary | ICD-10-CM | POA: Diagnosis not present

## 2023-03-16 DIAGNOSIS — N400 Enlarged prostate without lower urinary tract symptoms: Secondary | ICD-10-CM | POA: Diagnosis not present

## 2023-03-16 DIAGNOSIS — R972 Elevated prostate specific antigen [PSA]: Secondary | ICD-10-CM | POA: Insufficient documentation

## 2023-03-16 DIAGNOSIS — K5730 Diverticulosis of large intestine without perforation or abscess without bleeding: Secondary | ICD-10-CM | POA: Diagnosis not present

## 2023-03-16 MED ORDER — GADOBUTROL 1 MMOL/ML IV SOLN
8.0000 mL | Freq: Once | INTRAVENOUS | Status: AC | PRN
  Administered 2023-03-16: 8 mL via INTRAVENOUS

## 2023-03-20 ENCOUNTER — Encounter: Payer: Self-pay | Admitting: *Deleted

## 2023-04-11 ENCOUNTER — Ambulatory Visit (INDEPENDENT_AMBULATORY_CARE_PROVIDER_SITE_OTHER): Payer: Medicare Other | Admitting: Primary Care

## 2023-04-11 VITALS — BP 110/64 | HR 78 | Temp 97.6°F | Ht 69.0 in | Wt 186.0 lb

## 2023-04-11 DIAGNOSIS — Z7984 Long term (current) use of oral hypoglycemic drugs: Secondary | ICD-10-CM

## 2023-04-11 DIAGNOSIS — E785 Hyperlipidemia, unspecified: Secondary | ICD-10-CM

## 2023-04-11 DIAGNOSIS — E1165 Type 2 diabetes mellitus with hyperglycemia: Secondary | ICD-10-CM

## 2023-04-11 LAB — POCT GLYCOSYLATED HEMOGLOBIN (HGB A1C): Hemoglobin A1C: 5.5 % (ref 4.0–5.6)

## 2023-04-11 MED ORDER — ATORVASTATIN CALCIUM 10 MG PO TABS
10.0000 mg | ORAL_TABLET | Freq: Every day | ORAL | 1 refills | Status: DC
Start: 2023-04-11 — End: 2023-10-05

## 2023-04-11 NOTE — Assessment & Plan Note (Signed)
Reduce atorvastatin to 10 mg daily per patient request. New prescription sent to pharmacy.

## 2023-04-11 NOTE — Patient Instructions (Signed)
Continue metformin for diabetes.  We reduced the dose of your cholesterol medication atorvastatin to 10 mg daily.  Continue working on [employer].  Please schedule a physical to meet with me in 6 months.   It was a pleasure to see you today!

## 2023-04-11 NOTE — Assessment & Plan Note (Signed)
Well controlled with A1C today of 5.5.   Continue metformin XR 500 mg daily for now.  Consider discontinuation in 6 months if A1c remains within this range.  Reduce atorvastatin to 10 mg daily per patient request. Foot exam today. Follow-up in 6 months.

## 2023-04-11 NOTE — Progress Notes (Signed)
Subjective:    Patient ID: David Turner, male    DOB: Dec 13, 1950, 72 y.o.   MRN: [id number]  HPI  David Turner is a very pleasant 72 y.o. male with a history of hypertension, type 2 diabetes, hyperlipidemia, chronic back pain, gout who presents today for follow-up of diabetes.  Current medications include: Metformin XR 500 mg daily  He is checking his blood glucose 0 times daily.  Last A1C: 6.1 in January 2024, 5.5 today Last Eye Exam: Up-to-date Last Foot Exam: Due Pneumonia Vaccination: 2020 Urine Microalbumin: Up-to-date Statin: Atorvastatin 20 mg.  He would like to reduce his cholesterol medication to 10 mg daily.  Dietary changes since last visit: Mostly home cooked meals, eats plenty of veggies.    Exercise: Active outdoors, no regular exercise.   Wt Readings from Last 3 Encounters:  04/11/23 186 lb (84.4 kg)  03/05/23 180 lb (81.6 kg)  12/06/22 180 lb (81.6 kg)   BP Readings from Last 3 Encounters:  04/11/23 110/64  03/05/23 (!) 171/84  10/10/22 (!) 152/84      Review of Systems  Respiratory:  Negative for shortness of breath.   Cardiovascular:  Negative for chest pain.  Gastrointestinal:  Negative for diarrhea.  Neurological:  Negative for dizziness and numbness.         Past Medical History:  Diagnosis Date   Diabetes mellitus without complication (HCC)    Ear fullness, bilateral 09/29/2021   Gout    Hyperlipidemia    Hypertension    Wears dentures    partials, upper and lower    Social History   Socioeconomic History   Marital status: Married    Spouse name: Not on file   Number of children: Not on file   Years of education: Not on file   Highest education level: Not on file  Occupational History   Not on file  Tobacco Use   Smoking status: Former    Current packs/day: 0.00    Types: Cigarettes    Quit date: 2002    Years since quitting: 22.5   Smokeless tobacco: Never   Tobacco comments:    quit 2002  Vaping Use   Vaping  status: Never Used  Substance and Sexual Activity   Alcohol use: Yes    Alcohol/week: 0.0 standard drinks of alcohol    Comment: 6-8 beers   Drug use: Never   Sexual activity: Not on file  Other Topics Concern   Not on file  Social History Narrative   Married.   Travels to and from [place] often- son still lives there.   Social Determinants of Health   Financial Resource Strain: Low Risk  (12/06/2022)   Overall Financial Resource Strain (CARDIA)    Difficulty of Paying Living Expenses: Not hard at all  Food Insecurity: No Food Insecurity (12/06/2022)   Hunger Vital Sign    Worried About Running Out of Food in the Last Year: Never true    Ran Out of Food in the Last Year: Never true  Transportation Needs: No Transportation Needs (12/06/2022)   PRAPARE - Administrator, Civil Service (Medical): No    Lack of Transportation (Non-Medical): No  Physical Activity: Inactive (12/06/2022)   Exercise Vital Sign    Days of Exercise per Week: 0 days    Minutes of Exercise per Session: 0 min  Stress: No Stress Concern Present (12/06/2022)   [employer] of Occupational Health - Occupational Stress Questionnaire    Feeling of Stress :  Not at all  Social Connections: Moderately Isolated (12/06/2022)   Social Connection and Isolation Panel [NHANES]    Frequency of Communication with Friends and Family: More than three times a week    Frequency of Social Gatherings with Friends and Family: Never    Attends Religious Services: Never    Database administrator or Organizations: No    Attends Banker Meetings: Never    Marital Status: Married  Catering manager Violence: Not At Risk (12/06/2022)   Humiliation, Afraid, Rape, and Kick questionnaire    Fear of Current or Ex-Partner: No    Emotionally Abused: No    Physically Abused: No    Sexually Abused: No    Past Surgical History:  Procedure Laterality Date   CATARACT EXTRACTION W/PHACO Left 02/19/2019   Procedure:  CATARACT EXTRACTION PHACO AND INTRAOCULAR LENS PLACEMENT (IOC)  LEFT;  Surgeon: Lockie Mola, MD;  Location: [facility];  Service: Ophthalmology;  Laterality: Left;   COLONOSCOPY WITH PROPOFOL N/A 10/28/2020   Procedure: COLONOSCOPY WITH PROPOFOL;  Surgeon: Pasty Spillers, MD;  Location: [facility] END[facility];  Service: Endoscopy;  Laterality: N/A;   COLONOSCOPY WITH PROPOFOL N/A 11/04/2021   Procedure: COLONOSCOPY WITH PROPOFOL;  Surgeon: Wyline Mood, MD;  Location: [facility];  Service: Gastroenterology;  Laterality: N/A;   EYE SURGERY     JOINT REPLACEMENT     left knee surgeries  1983 1993, 2008   TOTAL HIP ARTHROPLASTY Left 05/19/2020    Family History  Problem Relation Age of Onset   Heart disease Father    Cancer Sister 65       colon   Heart disease Brother     No Known Allergies  Current Outpatient Medications on File Prior to Visit  Medication Sig Dispense Refill   allopurinol (ZYLOPRIM) 300 MG tablet TAKE 1 TABLET(300 MG) BY MOUTH DAILY FOR GOUT PREVENTION 90 tablet 1   aspirin 81 MG tablet Take 81 mg by mouth daily.     Garlic 1000 MG CAPS Take 2 capsules by mouth.     metFORMIN (GLUCOPHAGE-XR) 500 MG 24 hr tablet TAKE 1 TABLET(500 MG) BY MOUTH DAILY WITH BREAKFAST FOR DIABETES 90 tablet 1   Multiple Vitamin (MULTIVITAMIN) tablet Take 1 tablet by mouth daily.     Omega-3 Fatty Acids (FISH OIL) 1000 MG CAPS Take by mouth.     tamsulosin (FLOMAX) 0.4 MG CAPS capsule Take 2 capsules (0.8 mg total) by mouth daily after supper. For urine flow 180 capsule 0   timolol (BETIMOL) 0.5 % ophthalmic solution 1 drop at bedtime.     valsartan-hydrochlorothiazide (DIOVAN-HCT) 160-12.5 MG tablet TAKE 1 TABLET BY MOUTH DAILY FOR BLOOD PRESSURE 90 tablet 3   sildenafil (REVATIO) 20 MG tablet TAKE 2 TO 4 TABLETS BY MOUTH 1 HOUR BEFORE SEXUAL ACTIVITY (Patient not taking: Reported on 04/11/2023) 50 tablet 0   No current facility-administered medications on file prior to  visit.    BP 110/64   Pulse 78   Temp 97.6 F (36.4 C) (Temporal)   Ht 5\' 9"  (1.753 m)   Wt 186 lb (84.4 kg)   SpO2 98%   BMI 27.47 kg/m  Objective:   Physical Exam Cardiovascular:     Rate and Rhythm: Normal rate and regular rhythm.  Pulmonary:     Effort: Pulmonary effort is normal.     Breath sounds: Normal breath sounds. No wheezing or rales.  Musculoskeletal:     Cervical back: Neck supple.  Skin:  General: Skin is warm and dry.  Neurological:     Mental Status: He is alert and oriented to person, place, and time.           Assessment & Plan:  Type 2 diabetes mellitus with hyperglycemia, without long-term current use of insulin (HCC) Assessment & Plan: Well controlled with A1C today of 5.5.   Continue metformin XR 500 mg daily for now.  Consider discontinuation in 6 months if A1c remains within this range.  Reduce atorvastatin to 10 mg daily per patient request. Foot exam today. Follow-up in 6 months.  Orders: -     POCT glycosylated hemoglobin (Hb A1C)  Hyperlipidemia, unspecified hyperlipidemia type Assessment & Plan: Reduce atorvastatin to 10 mg daily per patient request. New prescription sent to pharmacy.  Orders: -     Atorvastatin Calcium; Take 1 tablet (10 mg total) by mouth daily. for cholesterol.  Dispense: 90 tablet; Refill: 1        Doreene Nest, NP

## 2023-04-18 ENCOUNTER — Other Ambulatory Visit: Payer: Self-pay | Admitting: Primary Care

## 2023-04-18 DIAGNOSIS — R3912 Poor urinary stream: Secondary | ICD-10-CM

## 2023-04-30 ENCOUNTER — Other Ambulatory Visit: Payer: Self-pay | Admitting: Primary Care

## 2023-04-30 DIAGNOSIS — N529 Male erectile dysfunction, unspecified: Secondary | ICD-10-CM

## 2023-05-02 ENCOUNTER — Encounter: Payer: Self-pay | Admitting: Urology

## 2023-05-02 ENCOUNTER — Ambulatory Visit: Payer: Medicare Other | Admitting: Urology

## 2023-05-02 VITALS — BP 144/75 | HR 68 | Ht 69.0 in | Wt 186.0 lb

## 2023-05-02 DIAGNOSIS — N4289 Other specified disorders of prostate: Secondary | ICD-10-CM | POA: Diagnosis not present

## 2023-05-02 DIAGNOSIS — Z2989 Encounter for other specified prophylactic measures: Secondary | ICD-10-CM | POA: Diagnosis not present

## 2023-05-02 DIAGNOSIS — C61 Malignant neoplasm of prostate: Secondary | ICD-10-CM | POA: Diagnosis not present

## 2023-05-02 DIAGNOSIS — R972 Elevated prostate specific antigen [PSA]: Secondary | ICD-10-CM

## 2023-05-02 MED ORDER — LEVOFLOXACIN 500 MG PO TABS
500.0000 mg | ORAL_TABLET | Freq: Once | ORAL | Status: AC
Start: 2023-05-02 — End: 2023-05-02
  Administered 2023-05-02: 500 mg via ORAL

## 2023-05-02 MED ORDER — GENTAMICIN SULFATE 40 MG/ML IJ SOLN
80.0000 mg | Freq: Once | INTRAMUSCULAR | Status: AC
Start: 2023-05-02 — End: 2023-05-02
  Administered 2023-05-02: 80 mg via INTRAMUSCULAR

## 2023-05-02 NOTE — Progress Notes (Signed)
   05/02/23  Indication: Elevated PSA, 5.96, abnormal prostate MRI  MRI Fusion Prostate Biopsy Procedure   Informed consent was obtained, and we discussed the risks of bleeding and infection/sepsis. A time out was performed to ensure correct patient identity.  Pre-Procedure: - Last PSA Level: 5.96 - Gentamicin and levaquin given for antibiotic prophylaxis -Prostate measured 50 g on MRI, PSA density 0.12 - No significant hypoechoic or median lobe noted  Procedure: - Prostate block performed using 10 cc 1% lidocaine  - MRI fusion biopsy was performed, and 3 biopsies were taken from the ROI PIRADS 4 lesion located left anterior peripheral zone mid gland and apex (3 cores) - Standard biopsies taken from sextant areas, 12 under ultrasound guidance. - Total of 15 cores taken  Post-Procedure: - Patient tolerated the procedure well - He was counseled to seek immediate medical attention if experiences significant bleeding, fevers, or severe pain - Return in one week to discuss biopsy results  Assessment/ Plan: Will follow up in 1-2 weeks to discuss pathology with Dr. Mort Sawyers, MD 05/02/2023

## 2023-05-02 NOTE — Patient Instructions (Addendum)

## 2023-05-07 DIAGNOSIS — H40003 Preglaucoma, unspecified, bilateral: Secondary | ICD-10-CM | POA: Diagnosis not present

## 2023-05-22 DIAGNOSIS — Z23 Encounter for immunization: Secondary | ICD-10-CM | POA: Diagnosis not present

## 2023-05-23 ENCOUNTER — Encounter: Payer: Self-pay | Admitting: Urology

## 2023-05-23 ENCOUNTER — Ambulatory Visit (INDEPENDENT_AMBULATORY_CARE_PROVIDER_SITE_OTHER): Payer: Medicare Other | Admitting: Urology

## 2023-05-23 VITALS — BP 125/73 | HR 94

## 2023-05-23 DIAGNOSIS — E119 Type 2 diabetes mellitus without complications: Secondary | ICD-10-CM

## 2023-05-23 DIAGNOSIS — R972 Elevated prostate specific antigen [PSA]: Secondary | ICD-10-CM

## 2023-05-23 DIAGNOSIS — C61 Malignant neoplasm of prostate: Secondary | ICD-10-CM | POA: Diagnosis not present

## 2023-05-23 MED ORDER — METFORMIN HCL ER 500 MG PO TB24
500.0000 mg | ORAL_TABLET | Freq: Every day | ORAL | 1 refills | Status: DC
Start: 2023-05-23 — End: 2023-11-20

## 2023-05-23 NOTE — Progress Notes (Signed)
I, Duke Salvia, acting as a Neurosurgeon for Riki Altes, MD.,have documented all relevant documentation on the behalf of Riki Altes, MD, as directed by  Riki Altes, MD while in the presence of Riki Altes, MD.  05/23/2023 6:29 PM   [name] Dec 23, 1950 [id number]  Referring provider: Doreene Nest, NP [address] [place],  [place] [address]  Chief Complaint  Patient presents with   Follow-up    Biopsy Results    HPI: Dharmender Mcphee is a 72 y.o. male presents for prostate biopsy follow up.  Biopsy performed by Dr. Richardo Hanks 05/02/23 for a PSA of 5.96 and PI-RADS 4 lesion on MRI. 3 ROI cores were taken in addition to standard 12 core template bxs .  He had no post biopsy complaints. ROI cores showed benign prostate tissue. Standard cores showed Gleason 3+4 adenocarcinoma RM (1%), RM (1%), and Gleason 3+3 adenocarcinoma LLA (1%).     PMH: Past Medical History:  Diagnosis Date   Diabetes mellitus without complication (HCC)    Ear fullness, bilateral 09/29/2021   Gout    Hyperlipidemia    Hypertension    Wears dentures    partials, upper and lower    Surgical History: Past Surgical History:  Procedure Laterality Date   CATARACT EXTRACTION W/PHACO Left 02/19/2019   Procedure: CATARACT EXTRACTION PHACO AND INTRAOCULAR LENS PLACEMENT (IOC)  LEFT;  Surgeon: Lockie Mola, MD;  Location: [facility];  Service: Ophthalmology;  Laterality: Left;   COLONOSCOPY WITH PROPOFOL N/A 10/28/2020   Procedure: COLONOSCOPY WITH PROPOFOL;  Surgeon: Pasty Spillers, MD;  Location: [facility];  Service: Endoscopy;  Laterality: N/A;   COLONOSCOPY WITH PROPOFOL N/A 11/04/2021   Procedure: COLONOSCOPY WITH PROPOFOL;  Surgeon: Wyline Mood, MD;  Location: [facility];  Service: Gastroenterology;  Laterality: N/A;   EYE SURGERY     JOINT REPLACEMENT     left knee surgeries  1983 1993, 2008   TOTAL HIP ARTHROPLASTY Left 05/19/2020    Home  Medications:  Allergies as of 05/23/2023   No Known Allergies      Medication List        Accurate as of May 23, 2023  6:29 PM. If you have any questions, ask your nurse or doctor.          allopurinol 300 MG tablet Commonly known as: ZYLOPRIM TAKE 1 TABLET(300 MG) BY MOUTH DAILY FOR GOUT PREVENTION   aspirin 81 MG tablet Take 81 mg by mouth daily.   atorvastatin 10 MG tablet Commonly known as: LIPITOR Take 1 tablet (10 mg total) by mouth daily. for cholesterol.   Fish Oil 1000 MG Caps Take by mouth.   Garlic 1000 MG Caps Take 2 capsules by mouth.   metFORMIN 500 MG 24 hr tablet Commonly known as: GLUCOPHAGE-XR Take 1 tablet (500 mg total) by mouth daily with breakfast. for diabetes. What changed: See the new instructions. Changed by: Generic Provider MyChart   multivitamin tablet Take 1 tablet by mouth daily.   sildenafil 20 MG tablet Commonly known as: REVATIO TAKE 2 TO 4 TABLETS BY MOUTH 1 HOUR BEFORE SEXUAL ACTIVITY   tamsulosin 0.4 MG Caps capsule Commonly known as: FLOMAX TAKE 2 CAPSULES BY MOUTH DAILY AFTER SUPPER FOR URINE FLOW.   timolol 0.5 % ophthalmic solution Commonly known as: BETIMOL 1 drop at bedtime.   valsartan-hydrochlorothiazide 160-12.5 MG tablet Commonly known as: DIOVAN-HCT TAKE 1 TABLET BY MOUTH DAILY FOR BLOOD PRESSURE  Family History: Family History  Problem Relation Age of Onset   Heart disease Father    Cancer Sister 101       colon   Heart disease Brother     Social History:  reports that he quit smoking about 22 years ago. His smoking use included cigarettes. He has been exposed to tobacco smoke. He has never used smokeless tobacco. He reports current alcohol use. He reports that he does not use drugs.   Physical Exam: BP 125/73   Pulse 94   Constitutional:  Alert and oriented, No acute distress. HEENT: [place] AT Respiratory: Normal respiratory effort, no increased work of breathing. Psychiatric:  Normal mood and affect.   Assessment & Plan:    1. T1c prostate cancer Small volume, favorable intermediate risk. We discussed management options for favorable intermediate risk prostate cancer, with the preferred option of active surveillance. We discussed this involves office visits every 6 months for PSA and annual DRE. We discussed the recommendation of a confirmatory biopsy within 1-2 years of his origincal biopsy and his MRI would be repeated prior to confirmatory biopsy. Radiation modalities and RALP were also discussed. He has elected active surveillance and a 6 month follow up was scheduled with PSA. Since he does hace intermediate risk disease, his tissue will be sent for a genomic prostate score.  I have reviewed the above documentation for accuracy and completeness, and I agree with the above.   Riki Altes, MD ] [facility] [address] [place], [place] [address] [phone]

## 2023-05-26 ENCOUNTER — Encounter: Payer: Self-pay | Admitting: Urology

## 2023-06-25 ENCOUNTER — Encounter: Payer: Self-pay | Admitting: Urology

## 2023-06-26 ENCOUNTER — Encounter: Payer: Self-pay | Admitting: Urology

## 2023-09-13 ENCOUNTER — Other Ambulatory Visit: Payer: Self-pay | Admitting: Primary Care

## 2023-09-13 DIAGNOSIS — M109 Gout, unspecified: Secondary | ICD-10-CM

## 2023-09-29 ENCOUNTER — Other Ambulatory Visit: Payer: Self-pay | Admitting: Internal Medicine

## 2023-09-29 DIAGNOSIS — I10 Essential (primary) hypertension: Secondary | ICD-10-CM

## 2023-10-05 ENCOUNTER — Other Ambulatory Visit: Payer: Self-pay | Admitting: Primary Care

## 2023-10-05 DIAGNOSIS — E785 Hyperlipidemia, unspecified: Secondary | ICD-10-CM

## 2023-10-11 ENCOUNTER — Ambulatory Visit (INDEPENDENT_AMBULATORY_CARE_PROVIDER_SITE_OTHER): Payer: Medicare Other | Admitting: Primary Care

## 2023-10-11 ENCOUNTER — Encounter: Payer: Self-pay | Admitting: Primary Care

## 2023-10-11 VITALS — BP 136/82 | HR 65 | Temp 97.3°F | Ht 69.0 in | Wt 191.0 lb

## 2023-10-11 DIAGNOSIS — M109 Gout, unspecified: Secondary | ICD-10-CM | POA: Diagnosis not present

## 2023-10-11 DIAGNOSIS — E1165 Type 2 diabetes mellitus with hyperglycemia: Secondary | ICD-10-CM

## 2023-10-11 DIAGNOSIS — Z7984 Long term (current) use of oral hypoglycemic drugs: Secondary | ICD-10-CM | POA: Diagnosis not present

## 2023-10-11 DIAGNOSIS — E785 Hyperlipidemia, unspecified: Secondary | ICD-10-CM

## 2023-10-11 DIAGNOSIS — R972 Elevated prostate specific antigen [PSA]: Secondary | ICD-10-CM

## 2023-10-11 DIAGNOSIS — I10 Essential (primary) hypertension: Secondary | ICD-10-CM

## 2023-10-11 LAB — LIPID PANEL
Cholesterol: 150 mg/dL (ref 0–200)
HDL: 63.6 mg/dL (ref >39.00)
LDL Cholesterol: 73 mg/dL (ref 0–99)
NonHDL: 86.4
Total CHOL/HDL Ratio: 2
Triglycerides: 69 mg/dL (ref 0.0–149.0)
VLDL: 13.8 mg/dL (ref 0.0–40.0)

## 2023-10-11 LAB — COMPREHENSIVE METABOLIC PANEL
ALT: 17 U/L (ref 0–53)
AST: 21 U/L (ref 0–37)
Albumin: 4.4 g/dL (ref 3.5–5.2)
Alkaline Phosphatase: 77 U/L (ref 39–117)
BUN: 11 mg/dL (ref 6–23)
CO2: 29 meq/L (ref 19–32)
Calcium: 9.6 mg/dL (ref 8.4–10.5)
Chloride: 102 meq/L (ref 96–112)
Creatinine, Ser: 0.79 mg/dL (ref 0.40–1.50)
GFR: 88.8 mL/min (ref >60.00)
Glucose, Bld: 100 mg/dL — ABNORMAL HIGH (ref 70–99)
Potassium: 4.2 meq/L (ref 3.5–5.1)
Sodium: 141 meq/L (ref 135–145)
Total Bilirubin: 0.7 mg/dL (ref 0.2–1.2)
Total Protein: 6.8 g/dL (ref 6.0–8.3)

## 2023-10-11 LAB — URIC ACID: Uric Acid, Serum: 4.8 mg/dL (ref 4.0–7.8)

## 2023-10-11 LAB — MICROALBUMIN / CREATININE URINE RATIO
Creatinine,U: 90 mg/dL
Microalb Creat Ratio: 0.8 mg/g (ref 0.0–30.0)
Microalb, Ur: <0.7 mg/dL (ref 0.0–1.9)

## 2023-10-11 LAB — HEMOGLOBIN A1C: Hgb A1c MFr Bld: 6.4 % (ref 4.6–6.5)

## 2023-10-11 NOTE — Assessment & Plan Note (Signed)
Repeat A1C pending.  He would like to come off metformin which may be reasonable given his readings over the last year. Await results.  Continue metformin ER 500 mg daily for now.  Urine microalbumin due and pending.  Follow-up in 6 months.

## 2023-10-11 NOTE — Assessment & Plan Note (Signed)
Continue atorvastatin 10 mg daily. Repeat lipid panel pending.

## 2023-10-11 NOTE — Patient Instructions (Signed)
Stop by the lab prior to leaving today. I will notify you of your results once received.   Please schedule a follow up visit for 6 months for a diabetes check.  It was a pleasure to see you today!

## 2023-10-11 NOTE — Assessment & Plan Note (Addendum)
Following with urology, office notes reviewed from September 2024. Follow-up in March as scheduled.  Continue tamsulosin 0.8 mg daily.

## 2023-10-11 NOTE — Assessment & Plan Note (Signed)
Controlled.  Continue valsartan-hydrochlorothiazide 160-12.5 mg daily. CMP pending.

## 2023-10-11 NOTE — Assessment & Plan Note (Signed)
No recent flares.  Continue allopurinol 300 mg daily. Repeat uric acid level pending.

## 2023-10-11 NOTE — Progress Notes (Signed)
Subjective:    Patient ID: David Turner, male    DOB: 1951-02-24, 73 y.o.   MRN: [id number]  HPI  [name] is a very pleasant 73 y.o. male with a history of hypertension, type 2 diabetes, hyperlipidemia, gout, back pain who presents today for follow-up of chronic conditions.  Immunizations: -Tetanus: Completed in 2019 -Influenza: Completed the season -Shingles: Completed Shingrix series -Pneumonia: Completed Prevnar 13 in 2019, Pneumovax 23 in 2020  Colonoscopy: Completed in 2023, due 2026  PSA: UTD   1) Type 2 Diabetes:  Current medications include: Metformin ER 500 mg daily.  He would like to discuss coming off of metformin given stability of his A1c over the last year.  He is checking his blood glucose 0 times daily.  Last A1C: 5.5 in July 2024 Last Eye Exam: Up-to-date Last Foot Exam: Up-to-date Pneumonia Vaccination: Completed in 2020 Urine Microalbumin: Due Statin: Atorvastatin  Dietary changes since last visit: None. Mostly home cooked meals. Limiting sugary foods.   Exercise: None.  2) Hypertension/Hyperlipidemia: Currently managed on valsartan-hydrochlorothiazide 160-12.5 mg daily, aspirin 81 mg daily, atorvastatin 10 mg daily.  He denies chest pain, shortness of breath, headaches, dizziness.   BP Readings from Last 3 Encounters:  10/11/23 136/82  05/23/23 125/73  05/02/23 (!) 144/75     3) Chronic Gout: Currently managed on allopurinol 300 mg daily. He denies recent gout flares. Feels well overall except for chronic left hip stiffness and fatigue since hip replacement several years ago.  He denies increased pain or symptoms, but questions if he needs reevaluation with his prior orthopedic surgeon.  4) Elevated PSA/Prostate Cancer: Following with urology, last office visit in September 2024.  Underwent biopsy of the prostate, Gleason 3+4 adenocarcinoma, and Gleason 3+4 adenocarcinoma LLA.  The plan is for continued surveillance with follow-up around  March 2025.  Also managed on tamsulosin 0.8 mg daily. Feels well managed. Denies urinary frequency, increased nocturia, hematuria, difficulty urinating.  Review of Systems  Respiratory:  Negative for shortness of breath.   Cardiovascular:  Negative for chest pain.  Musculoskeletal:  Positive for arthralgias.  Neurological:  Negative for dizziness and headaches.  Psychiatric/Behavioral:  The patient is not nervous/anxious.          Past Medical History:  Diagnosis Date   Diabetes mellitus without complication (HCC)    Ear fullness, bilateral 09/29/2021   Gout    Hyperlipidemia    Hypertension    Wears dentures    partials, upper and lower    Social History   Socioeconomic History   Marital status: Married    Spouse name: Not on file   Number of children: Not on file   Years of education: Not on file   Highest education level: Bachelor's degree (e.g., BA, AB, BS)  Occupational History   Not on file  Tobacco Use   Smoking status: Former    Current packs/day: 0.00    Types: Cigarettes    Quit date: 2002    Years since quitting: 23.0    Passive exposure: Past   Smokeless tobacco: Never   Tobacco comments:    quit 2002  Vaping Use   Vaping status: Never Used  Substance and Sexual Activity   Alcohol use: Yes    Alcohol/week: 0.0 standard drinks of alcohol    Comment: 6-8 beers   Drug use: Never   Sexual activity: Yes  Other Topics Concern   Not on file  Social History Narrative   Married.   Travels  to and from [place] often- son still lives there.   Social Drivers of Health   Financial Resource Strain: Patient Declined (10/08/2023)   Overall Financial Resource Strain (CARDIA)    Difficulty of Paying Living Expenses: Patient declined  Food Insecurity: No Food Insecurity (10/08/2023)   Hunger Vital Sign    Worried About Running Out of Food in the Last Year: Never true    Ran Out of Food in the Last Year: Never true  Transportation Needs: No Transportation  Needs (10/08/2023)   PRAPARE - Administrator, Civil Service (Medical): No    Lack of Transportation (Non-Medical): No  Physical Activity: Inactive (10/08/2023)   Exercise Vital Sign    Days of Exercise per Week: 0 days    Minutes of Exercise per Session: 0 min  Stress: No Stress Concern Present (10/08/2023)   [employer] of Occupational Health - Occupational Stress Questionnaire    Feeling of Stress : Not at all  Social Connections: Moderately Isolated (10/08/2023)   Social Connection and Isolation Panel [NHANES]    Frequency of Communication with Friends and Family: Three times a week    Frequency of Social Gatherings with Friends and Family: Patient declined    Attends Religious Services: Never    Database administrator or Organizations: No    Attends Banker Meetings: Never    Marital Status: Married  Catering manager Violence: Not At Risk (12/06/2022)   Humiliation, Afraid, Rape, and Kick questionnaire    Fear of Current or Ex-Partner: No    Emotionally Abused: No    Physically Abused: No    Sexually Abused: No    Past Surgical History:  Procedure Laterality Date   CATARACT EXTRACTION W/PHACO Left 02/19/2019   Procedure: CATARACT EXTRACTION PHACO AND INTRAOCULAR LENS PLACEMENT (IOC)  LEFT;  Surgeon: Lockie Mola, MD;  Location: [facility];  Service: Ophthalmology;  Laterality: Left;   COLONOSCOPY WITH PROPOFOL N/A 10/28/2020   Procedure: COLONOSCOPY WITH PROPOFOL;  Surgeon: Pasty Spillers, MD;  Location: [facility];  Service: Endoscopy;  Laterality: N/A;   COLONOSCOPY WITH PROPOFOL N/A 11/04/2021   Procedure: COLONOSCOPY WITH PROPOFOL;  Surgeon: Wyline Mood, MD;  Location: [facility];  Service: Gastroenterology;  Laterality: N/A;   EYE SURGERY     JOINT REPLACEMENT     left knee surgeries  1983 1993, 2008   TOTAL HIP ARTHROPLASTY Left 05/19/2020    Family History  Problem Relation Age of Onset   Heart disease  Father    Cancer Sister 20       colon   Heart disease Brother     No Known Allergies  Current Outpatient Medications on File Prior to Visit  Medication Sig Dispense Refill   allopurinol (ZYLOPRIM) 300 MG tablet TAKE 1 TABLET(300 MG) BY MOUTH DAILY FOR GOUT PREVENTION 90 tablet 0   aspirin 81 MG tablet Take 81 mg by mouth daily.     atorvastatin (LIPITOR) 10 MG tablet Take 1 tablet (10 mg total) by mouth daily. for cholesterol. 90 tablet 0   Garlic 1000 MG CAPS Take 2 capsules by mouth.     metFORMIN (GLUCOPHAGE-XR) 500 MG 24 hr tablet Take 1 tablet (500 mg total) by mouth daily with breakfast. for diabetes. 90 tablet 1   Multiple Vitamin (MULTIVITAMIN) tablet Take 1 tablet by mouth daily.     Omega-3 Fatty Acids (FISH OIL) 1000 MG CAPS Take by mouth.     tamsulosin (FLOMAX) 0.4 MG CAPS capsule  TAKE 2 CAPSULES BY MOUTH DAILY AFTER SUPPER FOR URINE FLOW. 180 capsule 1   timolol (BETIMOL) 0.5 % ophthalmic solution 1 drop at bedtime.     valsartan-hydrochlorothiazide (DIOVAN-HCT) 160-12.5 MG tablet TAKE 1 TABLET BY MOUTH DAILY FOR BLOOD PRESSURE 90 tablet 0   No current facility-administered medications on file prior to visit.    BP 136/82   Pulse 65   Temp (!) 97.3 F (36.3 C) (Temporal)   Ht 5\' 9"  (1.753 m)   Wt 191 lb (86.6 kg)   SpO2 98%   BMI 28.21 kg/m  Objective:   Physical Exam Cardiovascular:     Rate and Rhythm: Normal rate and regular rhythm.  Pulmonary:     Effort: Pulmonary effort is normal.     Breath sounds: Normal breath sounds.  Abdominal:     General: Bowel sounds are normal.     Palpations: Abdomen is soft.     Tenderness: There is no abdominal tenderness.  Musculoskeletal:     Cervical back: Neck supple.  Skin:    General: Skin is warm and dry.  Neurological:     Mental Status: He is alert and oriented to person, place, and time.  Psychiatric:        Mood and Affect: Mood normal.           Assessment & Plan:  Type 2 diabetes mellitus with  hyperglycemia, without long-term current use of insulin (HCC) Assessment & Plan: Repeat A1C pending.  He would like to come off metformin which may be reasonable given his readings over the last year. Await results.  Continue metformin ER 500 mg daily for now.  Urine microalbumin due and pending.  Follow-up in 6 months.  Orders: -     Microalbumin / creatinine urine ratio -     Hemoglobin A1c  Hyperlipidemia, unspecified hyperlipidemia type Assessment & Plan: Continue atorvastatin 10 mg daily. Repeat lipid panel pending.  Orders: -     Lipid panel -     Comprehensive metabolic panel  Gout of foot, unspecified cause, unspecified chronicity, unspecified laterality Assessment & Plan: No recent flares.  Continue allopurinol 300 mg daily. Repeat uric acid level pending.  Orders: -     Uric acid  Primary hypertension Assessment & Plan: Controlled.  Continue valsartan-hydrochlorothiazide 160-12.5 mg daily. CMP pending.   Elevated PSA Assessment & Plan: Following with urology, office notes reviewed from September 2024. Follow-up in March as scheduled.  Continue tamsulosin 0.8 mg daily.         Doreene Nest, NP

## 2023-10-23 DIAGNOSIS — R3912 Poor urinary stream: Secondary | ICD-10-CM

## 2023-10-24 MED ORDER — TAMSULOSIN HCL 0.4 MG PO CAPS: 0.8000 mg | ORAL_CAPSULE | Freq: Every day | ORAL | 2 refills | Status: DC

## 2023-10-29 DIAGNOSIS — H40003 Preglaucoma, unspecified, bilateral: Secondary | ICD-10-CM | POA: Diagnosis not present

## 2023-11-02 LAB — HM DIABETES EYE EXAM

## 2023-11-05 DIAGNOSIS — D3101 Benign neoplasm of right conjunctiva: Secondary | ICD-10-CM | POA: Diagnosis not present

## 2023-11-05 DIAGNOSIS — E119 Type 2 diabetes mellitus without complications: Secondary | ICD-10-CM | POA: Diagnosis not present

## 2023-11-05 DIAGNOSIS — Z961 Presence of intraocular lens: Secondary | ICD-10-CM | POA: Diagnosis not present

## 2023-11-05 DIAGNOSIS — H40003 Preglaucoma, unspecified, bilateral: Secondary | ICD-10-CM | POA: Diagnosis not present

## 2023-11-19 ENCOUNTER — Other Ambulatory Visit: Payer: Self-pay

## 2023-11-19 DIAGNOSIS — C61 Malignant neoplasm of prostate: Secondary | ICD-10-CM

## 2023-11-20 ENCOUNTER — Other Ambulatory Visit: Payer: TRICARE For Life (TFL)

## 2023-11-20 ENCOUNTER — Other Ambulatory Visit: Payer: Self-pay

## 2023-11-20 DIAGNOSIS — C61 Malignant neoplasm of prostate: Secondary | ICD-10-CM | POA: Diagnosis not present

## 2023-11-20 DIAGNOSIS — E119 Type 2 diabetes mellitus without complications: Secondary | ICD-10-CM

## 2023-11-20 MED ORDER — METFORMIN HCL ER 500 MG PO TB24: 500.0000 mg | ORAL_TABLET | Freq: Every day | ORAL | 1 refills | Status: DC

## 2023-11-21 LAB — PSA: Prostate Specific Ag, Serum: 6.9 ng/mL — ABNORMAL HIGH (ref 0.0–4.0)

## 2023-11-23 ENCOUNTER — Encounter: Payer: Self-pay | Admitting: Urology

## 2023-11-23 ENCOUNTER — Ambulatory Visit (INDEPENDENT_AMBULATORY_CARE_PROVIDER_SITE_OTHER): Payer: Medicare Other | Admitting: Urology

## 2023-11-23 VITALS — BP 161/80 | HR 61 | Ht 69.0 in | Wt 186.0 lb

## 2023-11-23 DIAGNOSIS — R972 Elevated prostate specific antigen [PSA]: Secondary | ICD-10-CM

## 2023-11-23 DIAGNOSIS — C61 Malignant neoplasm of prostate: Secondary | ICD-10-CM

## 2023-11-23 NOTE — Progress Notes (Signed)
 I, David Turner, acting as a scribe for David Altes, MD., have documented all relevant documentation on the behalf of David Altes, MD, as directed by David Altes, MD while in the presence of David Altes, MD.  11/23/2023 12:45 PM   David Turner [birth year] [id number]  Referring provider: Doreene Nest, NP [address] [place],  [place] [address]  Chief Complaint  Patient presents with   Elevated PSA   Urologic history 1. T1c favorable intermediate risk prostate cancer PSA 10/10/22 was 6.45 which was persistently elevated on repeat at 5.96  Prostate MRI 03/16/23 showed a 50 gram prostate volume with a PIRADS 4 lesion left anterior pz. MR Fusion biopsy 05/02/23 with ROI cores showing benign prostate tissue and 2 cores right prostate showing Gleason 3+4 adenocarcinoma involving 1% of submitted tissue each core and Gleason 3+3 adenocarcinoma involving 1% at left lateral apex  GPS low at 6 Elected active surveillance.   HPI: David Turner is a 73 y.o. male presents for a 6 month follow-up.   No complaints since last visit. PSA 11/20/23 increased slightly at 6.9 Denies dysuria, gross hematuria Denies flank, abdominal, or pelvic pain.    PMH: Past Medical History:  Diagnosis Date   Diabetes mellitus without complication (HCC)    Ear fullness, bilateral 09/29/2021   Gout    Hyperlipidemia    Hypertension    Wears dentures    partials, upper and lower    Surgical History: Past Surgical History:  Procedure Laterality Date   CATARACT EXTRACTION W/PHACO Left 02/19/2019   Procedure: CATARACT EXTRACTION PHACO AND INTRAOCULAR LENS PLACEMENT (IOC)  LEFT;  Surgeon: Lockie Mola, MD;  Location: [facility];  Service: Ophthalmology;  Laterality: Left;   COLONOSCOPY WITH PROPOFOL N/A 10/28/2020   Procedure: COLONOSCOPY WITH PROPOFOL;  Surgeon: Pasty Spillers, MD;  Location: [facility];  Service: Endoscopy;  Laterality: N/A;   COLONOSCOPY  WITH PROPOFOL N/A 11/04/2021   Procedure: COLONOSCOPY WITH PROPOFOL;  Surgeon: Wyline Mood, MD;  Location: [facility];  Service: Gastroenterology;  Laterality: N/A;   EYE SURGERY     JOINT REPLACEMENT     left knee surgeries  1983 1993, 2008   TOTAL HIP ARTHROPLASTY Left 05/19/2020    Home Medications:  Allergies as of 11/23/2023   No Known Allergies      Medication List        Accurate as of November 23, 2023 12:45 PM. If you have any questions, ask your nurse or doctor.          allopurinol 300 MG tablet Commonly known as: ZYLOPRIM TAKE 1 TABLET(300 MG) BY MOUTH DAILY FOR GOUT PREVENTION   aspirin 81 MG tablet Take 81 mg by mouth daily.   atorvastatin 10 MG tablet Commonly known as: LIPITOR Take 1 tablet (10 mg total) by mouth daily. for cholesterol.   Fish Oil 1000 MG Caps Take by mouth.   Garlic 1000 MG Caps Take 2 capsules by mouth.   metFORMIN 500 MG 24 hr tablet Commonly known as: GLUCOPHAGE-XR Take 1 tablet (500 mg total) by mouth daily with breakfast. for diabetes.   multivitamin tablet Take 1 tablet by mouth daily.   tamsulosin 0.4 MG Caps capsule Commonly known as: FLOMAX Take 2 capsules (0.8 mg total) by mouth daily. For urine flow   timolol 0.5 % ophthalmic solution Commonly known as: BETIMOL 1 drop at bedtime.   valsartan-hydrochlorothiazide 160-12.5 MG tablet Commonly known as: DIOVAN-HCT TAKE 1  TABLET BY MOUTH DAILY FOR BLOOD PRESSURE        Allergies: No Known Allergies  Family History: Family History  Problem Relation Age of Onset   Heart disease Father    Cancer Sister 62       colon   Heart disease Brother     Social History:  reports that he quit smoking about 23 years ago. His smoking use included cigarettes. He has been exposed to tobacco smoke. He has never used smokeless tobacco. He reports current alcohol use. He reports that he does not use drugs.   Physical Exam: BP (!) 161/80   Pulse 61   Ht 5\' 9"  (1.753 m)    Wt 186 lb (84.4 kg)   BMI 27.47 kg/m   Constitutional:  Alert and oriented, No acute distress. HEENT: [place] AT Respiratory: Normal respiratory effort, no increased work of breathing. Psychiatric: Normal mood and affect.   Assessment & Plan:    1. T1c favorable intermediate risk prostate cancer Slight PSA bump 6.9 Will repeat PSA in 3 months and if rising, obtain a follow-up prostate MRI.  We again discussed the recommendation of a confirmatory biopsy within 1-2 years of his initial biopsy. We also discussed treatment options including radiation modalities and radical prostatectomy.  I have reviewed the above documentation for accuracy and completeness, and I agree with the above.   David Altes, MD  [facility] [address], [address] [place], [place] [address] [phone]

## 2023-12-12 ENCOUNTER — Other Ambulatory Visit: Payer: Self-pay

## 2023-12-12 DIAGNOSIS — M109 Gout, unspecified: Secondary | ICD-10-CM

## 2023-12-12 MED ORDER — ALLOPURINOL 300 MG PO TABS: ORAL_TABLET | ORAL | 1 refills | Status: DC

## 2023-12-26 ENCOUNTER — Other Ambulatory Visit: Payer: Self-pay

## 2023-12-26 DIAGNOSIS — I10 Essential (primary) hypertension: Secondary | ICD-10-CM

## 2023-12-27 MED ORDER — VALSARTAN-HYDROCHLOROTHIAZIDE 160-12.5 MG PO TABS: 1.0000 | ORAL_TABLET | Freq: Every day | ORAL | 2 refills | Status: DC

## 2023-12-31 ENCOUNTER — Other Ambulatory Visit: Payer: Self-pay

## 2023-12-31 DIAGNOSIS — E785 Hyperlipidemia, unspecified: Secondary | ICD-10-CM

## 2023-12-31 MED ORDER — ATORVASTATIN CALCIUM 10 MG PO TABS: 10.0000 mg | ORAL_TABLET | Freq: Every day | ORAL | 1 refills | Status: DC

## 2024-02-07 ENCOUNTER — Encounter

## 2024-02-21 ENCOUNTER — Encounter

## 2024-02-25 ENCOUNTER — Other Ambulatory Visit

## 2024-02-25 DIAGNOSIS — R972 Elevated prostate specific antigen [PSA]: Secondary | ICD-10-CM | POA: Diagnosis not present

## 2024-02-26 ENCOUNTER — Ambulatory Visit: Payer: Self-pay | Admitting: Urology

## 2024-02-26 LAB — PSA: Prostate Specific Ag, Serum: 4.4 ng/mL — ABNORMAL HIGH (ref 0.0–4.0)

## 2024-03-18 ENCOUNTER — Ambulatory Visit (INDEPENDENT_AMBULATORY_CARE_PROVIDER_SITE_OTHER)

## 2024-03-18 VITALS — BP 161/81 | Ht 69.0 in | Wt 185.0 lb

## 2024-03-18 DIAGNOSIS — E1165 Type 2 diabetes mellitus with hyperglycemia: Secondary | ICD-10-CM

## 2024-03-18 DIAGNOSIS — Z0000 Encounter for general adult medical examination without abnormal findings: Secondary | ICD-10-CM | POA: Diagnosis not present

## 2024-03-18 NOTE — Patient Instructions (Signed)
 Mr. Durr , Thank you for taking time out of your busy schedule to complete your Annual Wellness Visit with me. I enjoyed our conversation and look forward to speaking with you again next year. I, as well as your care team,  appreciate your ongoing commitment to your health goals. Please review the following plan we discussed and let me know if I can assist you in the future. Your Game plan/ To Do List    Referrals: If you haven't heard from the office you've been referred to, please reach out to them at the phone provided.  none Follow up Visits: Next Medicare AWV with our clinical staff: 03/19/2025   Have you seen your provider in the last 6 months (3 months if uncontrolled diabetes)? Yes Next Office Visit with your provider: 04/09/2024  Clinician Recommendations:  Aim for 30 minutes of exercise or brisk walking, 6-8 glasses of water, and 5 servings of fruits and vegetables each day.       This is a list of the screening recommended for you and due dates:  Health Maintenance  Topic Date Due   Yearly kidney health urinalysis for diabetes  Never done   Hemoglobin A1C  04/09/2024   Complete foot exam   04/10/2024   Flu Shot  04/11/2024   Yearly kidney function blood test for diabetes  10/10/2024   Eye exam for diabetics  11/01/2024   Colon Cancer Screening  11/04/2024   Medicare Annual Wellness Visit  03/18/2025   DTaP/Tdap/Td vaccine (2 - Td or Tdap) 11/27/2027   Pneumococcal Vaccine for age over 59  Completed   Hepatitis C Screening  Completed   Zoster (Shingles) Vaccine  Completed   Hepatitis B Vaccine  Aged Out   HPV Vaccine  Aged Out   Meningitis B Vaccine  Aged Out   COVID-19 Vaccine  Discontinued    Advanced directives: (Declined) Advance directive discussed with you today. Even though you declined this today, please call our office should you change your mind, and we can give you the proper paperwork for you to fill out. Advance Care Planning is important because  it:  [x]  Makes sure you receive the medical care that is consistent with your values, goals, and preferences  [x]  It provides guidance to your family and loved ones and reduces their decisional burden about whether or not they are making the right decisions based on your wishes.  Follow the link provided in your after visit summary or read over the paperwork we have mailed to you to help you started getting your Advance Directives in place. If you need assistance in completing these, please reach out to us  so that we can help you!  See attachments for Preventive Care and Fall Prevention Tips.

## 2024-03-18 NOTE — Progress Notes (Signed)
 Because this visit was a virtual/telehealth visit,  certain criteria was not obtained, such a blood pressure, CBG if applicable, and timed get up and go. Any medications not marked as taking were not mentioned during the medication reconciliation part of the visit. Any vitals not documented were not able to be obtained due to this being a telehealth visit or patient was unable to self-report a recent blood pressure reading due to a lack of equipment at home via telehealth. Vitals that have been documented are verbally provided by the patient.  This visit was performed by a medical professional under my direct supervision. I was immediately available for consultation/collaboration. I have reviewed and agree with the Annual Wellness Visit documentation.  Subjective:   David Turner is a 73 y.o. who presents for a Medicare Wellness preventive visit.  As a reminder, Annual Wellness Visits don't include a physical exam, and some assessments may be limited, especially if this visit is performed virtually. We may recommend an in-person follow-up visit with your provider if needed.  Visit Complete: Virtual I connected with  [employer] on 03/18/24 by a audio enabled telemedicine application and verified that I am speaking with the correct person using two identifiers.  Patient Location: Home  Provider Location: Home Office  I discussed the limitations of evaluation and management by telemedicine. The patient expressed understanding and agreed to proceed.  Vital Signs: Because this visit was a virtual/telehealth visit, some criteria may be missing or patient reported. Any vitals not documented were not able to be obtained and vitals that have been documented are patient reported.  VideoDeclined- This patient declined Librarian, academic. Therefore the visit was completed with audio only.  Persons Participating in Visit: Patient.  AWV Questionnaire: Yes: Patient Medicare  AWV questionnaire was completed by the patient on 03/11/2024; I have confirmed that all information answered by patient is correct and no changes since this date.  Cardiac Risk Factors include: male gender;advanced age (>78men, >63 women);diabetes mellitus;dyslipidemia;hypertension     Objective:    Today's Vitals   03/18/24 1442  BP: (!) 161/81  Weight: 185 lb (83.9 kg)  Height: 5' 9 (1.753 m)   Body mass index is 27.32 kg/m.     03/18/2024    2:46 PM 12/06/2022   12:57 PM 11/17/2021    2:00 PM 11/04/2021    6:50 AM 10/28/2020    7:28 AM 02/19/2019    9:59 AM 05/07/2018    4:14 PM  Advanced Directives  Does Patient Have a Medical Advance Directive? No No No No No No No   Would patient like information on creating a medical advance directive? No - Patient declined No - Patient declined Yes (MAU/Ambulatory/Procedural Areas - Information given)  No - Patient declined No - Patient declined  No - Patient declined      Data saved with a previous flowsheet row definition    Current Medications (verified) Outpatient Encounter Medications as of 03/18/2024  Medication Sig   allopurinol  (ZYLOPRIM ) 300 MG tablet TAKE 1 TABLET(300 MG) BY MOUTH DAILY FOR GOUT PREVENTION   aspirin 81 MG tablet Take 81 mg by mouth daily.   atorvastatin  (LIPITOR) 10 MG tablet Take 1 tablet (10 mg total) by mouth daily. for cholesterol.   Garlic 1000 MG CAPS Take 2 capsules by mouth.   metFORMIN  (GLUCOPHAGE -XR) 500 MG 24 hr tablet Take 1 tablet (500 mg total) by mouth daily with breakfast. for diabetes.   Multiple Vitamin (MULTIVITAMIN) tablet Take 1 tablet  by mouth daily.   Omega-3 Fatty Acids (FISH OIL) 1000 MG CAPS Take by mouth.   tamsulosin  (FLOMAX ) 0.4 MG CAPS capsule Take 2 capsules (0.8 mg total) by mouth daily. For urine flow   timolol  (BETIMOL ) 0.5 % ophthalmic solution 1 drop at bedtime.   valsartan -hydrochlorothiazide  (DIOVAN -HCT) 160-12.5 MG tablet Take 1 tablet by mouth daily. For blood pressure.    No facility-administered encounter medications on file as of 03/18/2024.    Allergies (verified) Patient has no known allergies.   History: Past Medical History:  Diagnosis Date   Diabetes mellitus without complication (HCC)    Ear fullness, bilateral 09/29/2021   Gout    Hyperlipidemia    Hypertension    Wears dentures    partials, upper and lower   Past Surgical History:  Procedure Laterality Date   CATARACT EXTRACTION W/PHACO Left 02/19/2019   Procedure: CATARACT EXTRACTION PHACO AND INTRAOCULAR LENS PLACEMENT (IOC)  LEFT;  Surgeon: Mittie Gaskin, MD;  Location: [facility];  Service: Ophthalmology;  Laterality: Left;   COLONOSCOPY WITH PROPOFOL  N/A 10/28/2020   Procedure: COLONOSCOPY WITH PROPOFOL ;  Surgeon: Janalyn Keene NOVAK, MD;  Location: [facility] ENDOSCOPY;  Service: Endoscopy;  Laterality: N/A;   COLONOSCOPY WITH PROPOFOL  N/A 11/04/2021   Procedure: COLONOSCOPY WITH PROPOFOL ;  Surgeon: Therisa Bi, MD;  Location: [facility] ENDOSCOPY;  Service: Gastroenterology;  Laterality: N/A;   EYE SURGERY     JOINT REPLACEMENT     left knee surgeries  1983 1993, 2008   TOTAL HIP ARTHROPLASTY Left 05/19/2020   Family History  Problem Relation Age of Onset   Heart disease Father    Cancer Sister 31       colon   Heart disease Brother    Social History   Socioeconomic History   Marital status: Married    Spouse name: Not on file   Number of children: Not on file   Years of education: Not on file   Highest education level: Bachelor's degree (e.g., BA, AB, BS)  Occupational History   Not on file  Tobacco Use   Smoking status: Former    Current packs/day: 0.00    Types: Cigarettes    Quit date: 2002    Years since quitting: 23.5    Passive exposure: Past   Smokeless tobacco: Never   Tobacco comments:    quit 2002  Vaping Use   Vaping status: Never Used  Substance and Sexual Activity   Alcohol use: Yes    Alcohol/week: 0.0 standard drinks of alcohol     Comment: 6-8 beers   Drug use: Never   Sexual activity: Yes  Other Topics Concern   Not on file  Social History Narrative   Married.   Travels to and from [place]  often- son still lives there.   Social Drivers of Corporate investment banker Strain: Low Risk  (03/11/2024)   Overall Financial Resource Strain (CARDIA)    Difficulty of Paying Living Expenses: Not hard at all  Food Insecurity: No Food Insecurity (03/11/2024)   Hunger Vital Sign    Worried About Running Out of Food in the Last Year: Never true    Ran Out of Food in the Last Year: Never true  Transportation Needs: No Transportation Needs (03/11/2024)   PRAPARE - Administrator, Civil Service (Medical): No    Lack of Transportation (Non-Medical): No  Physical Activity: Insufficiently Active (03/11/2024)   Exercise Vital Sign    Days of Exercise per Week: 1 day  Minutes of Exercise per Session: 10 min  Stress: No Stress Concern Present (03/11/2024)   [employer] of Occupational Health - Occupational Stress Questionnaire    Feeling of Stress: Not at all  Social Connections: Moderately Isolated (03/11/2024)   Social Connection and Isolation Panel    Frequency of Communication with Friends and Family: Three times a week    Frequency of Social Gatherings with Friends and Family: Three times a week    Attends Religious Services: Never    Active Member of Clubs or Organizations: No    Attends Banker Meetings: Never    Marital Status: Married    Tobacco Counseling Counseling given: Not Answered Tobacco comments: quit 2002    Clinical Intake:  Pre-visit preparation completed: Yes  Pain : No/denies pain     BMI - recorded: 27.32 Nutritional Status: BMI 25 -29 Overweight Nutritional Risks: None Diabetes: No  Lab Results  Component Value Date   HGBA1C 6.4 10/11/2023   HGBA1C 5.5 04/11/2023   HGBA1C 6.1 10/10/2022     How often do you need to have someone help you when you read  instructions, pamphlets, or other written materials from your doctor or pharmacy?: 1 - Never What is the last grade level you completed in school?: college graduate  Interpreter Needed?: No  Information entered by :: Shah Insley,cma   Activities of Daily Living     03/11/2024    9:58 AM  In your present state of health, do you have any difficulty performing the following activities:  Hearing? 0  Vision? 0  Difficulty concentrating or making decisions? 0  Walking or climbing stairs? 0  Dressing or bathing? 0  Doing errands, shopping? 0  Preparing Food and eating ? N  Using the Toilet? N  In the past six months, have you accidently leaked urine? N  Do you have problems with loss of bowel control? N  Managing your Medications? N  Managing your Finances? N  Housekeeping or managing your Housekeeping? N    Patient Care Team: Gretta Comer POUR, NP as PCP - General (Internal Medicine) Leora Lynwood SAUNDERS, MD as Consulting Physician (Orthopedic Surgery)  I have updated your Care Teams any recent Medical Services you may have received from other providers in the past year.     Assessment:   This is a routine wellness examination for Dearius.  Hearing/Vision screen Hearing Screening - Comments:: No difficulties  Vision Screening - Comments:: No vision issues    Goals Addressed             This Visit's Progress    Patient Stated       Patient is trying to fix everything in his new home       Depression Screen     03/18/2024    2:47 PM 10/11/2023   10:58 AM 04/11/2023    2:24 PM 12/06/2022   12:56 PM 10/10/2022   11:37 AM 11/17/2021    2:04 PM 10/27/2021   10:22 AM  PHQ 2/9 Scores  PHQ - 2 Score 0 0 0 0 0 0 0  PHQ- 9 Score 0  0    0    Fall Risk     03/11/2024    9:58 AM 10/11/2023   10:58 AM 04/11/2023    2:24 PM 12/06/2022   12:49 PM 10/10/2022   11:36 AM  Fall Risk   Falls in the past year? 0 0 0 0 0  Number falls in past yr: 0  0 0 0 0  Injury with Fall? 0 0 0 0  0  Risk for fall due to : No Fall Risks No Fall Risks No Fall Risks No Fall Risks No Fall Risks  Follow up Falls evaluation completed Falls evaluation completed Falls evaluation completed Falls prevention discussed;Falls evaluation completed Falls evaluation completed    MEDICARE RISK AT HOME:  Medicare Risk at Home Any stairs in or around the home?: (Patient-Rptd) Yes If so, are there any without handrails?: (Patient-Rptd) No Home free of loose throw rugs in walkways, pet beds, electrical cords, etc?: (Patient-Rptd) Yes Adequate lighting in your home to reduce risk of falls?: (Patient-Rptd) Yes Life alert?: (Patient-Rptd) No Use of a cane, walker or w/c?: (Patient-Rptd) No Grab bars in the bathroom?: (Patient-Rptd) Yes Shower chair or bench in shower?: (Patient-Rptd) No Elevated toilet seat or a handicapped toilet?: (Patient-Rptd) Yes  TIMED UP AND GO:  Was the test performed?  No  Cognitive Function: 6CIT completed        03/18/2024    2:45 PM 12/06/2022   12:58 PM  6CIT Screen  What Year? 0 points 0 points  What month? 0 points 0 points  What time? 0 points 0 points  Count back from 20 0 points 0 points  Months in reverse 0 points 0 points  Repeat phrase 0 points 0 points  Total Score 0 points 0 points    Immunizations Immunization History  Administered Date(s) Administered   Fluad Quad(high Dose 65+) 06/19/2019, 06/16/2020, 07/15/2021, 06/01/2022   Influenza, High Dose Seasonal PF 05/22/2023   Moderna Covid-19 Fall Seasonal Vaccine 73yrs & older 06/21/2022   PFIZER Comirnaty(Gray Top)Covid-19 Tri-Sucrose Vaccine 12/31/2020   PFIZER(Purple Top)SARS-COV-2 Vaccination 10/26/2019, 11/25/2019, 06/23/2020   Pfizer Covid-19 Vaccine Bivalent Booster 56yrs & up 07/04/2021   Pfizer(Comirnaty)Fall Seasonal Vaccine 12 years and older 05/22/2023   Pneumococcal Conjugate-13 11/26/2017   Pneumococcal Polysaccharide-23 06/19/2019   Respiratory Syncytial Virus Vaccine,Recomb  Aduvanted(Arexvy) 08/30/2022   Tdap 11/26/2017   Zoster Recombinant(Shingrix ) 07/08/2019, 09/09/2019   Zoster, Live 11/05/2013    Screening Tests Health Maintenance  Topic Date Due   Diabetic kidney evaluation - Urine ACR  Never done   HEMOGLOBIN A1C  04/09/2024   FOOT EXAM  04/10/2024   INFLUENZA VACCINE  04/11/2024   Diabetic kidney evaluation - eGFR measurement  10/10/2024   OPHTHALMOLOGY EXAM  11/01/2024   Colonoscopy  11/04/2024   Medicare Annual Wellness (AWV)  03/18/2025   DTaP/Tdap/Td (2 - Td or Tdap) 11/27/2027   Pneumococcal Vaccine: 50+ Years  Completed   Hepatitis C Screening  Completed   Zoster Vaccines- Shingrix   Completed   Hepatitis B Vaccines  Aged Out   HPV VACCINES  Aged Out   Meningococcal B Vaccine  Aged Out   COVID-19 Vaccine  Discontinued    Health Maintenance  Health Maintenance Due  Topic Date Due   Diabetic kidney evaluation - Urine ACR  Never done   Health Maintenance Items Addressed:patient diabetic kidney exam was ordered for future   Additional Screening:  Vision Screening: Recommended annual ophthalmology exams for early detection of glaucoma and other disorders of the eye. Would you like a referral to an eye doctor? No    Dental Screening: Recommended annual dental exams for proper oral hygiene  Community Resource Referral / Chronic Care Management: CRR required this visit?  No   CCM required this visit?  No   Plan:    I have personally reviewed and noted the following in the patient's  chart:   Medical and social history Use of alcohol, tobacco or illicit drugs  Current medications and supplements including opioid prescriptions. Patient is not currently taking opioid prescriptions. Functional ability and status Nutritional status Physical activity Advanced directives List of other physicians Hospitalizations, surgeries, and ER visits in previous 12 months Vitals Screenings to include cognitive, depression, and  falls Referrals and appointments  In addition, I have reviewed and discussed with patient certain preventive protocols, quality metrics, and best practice recommendations. A written personalized care plan for preventive services as well as general preventive health recommendations were provided to patient.   Lyle MARLA Right, [place]   03/18/2024   After Visit Summary: (MyChart) Due to this being a telephonic visit, the after visit summary with patients personalized plan was offered to patient via MyChart   Notes: Nothing significant to report at this time.

## 2024-04-09 ENCOUNTER — Ambulatory Visit (INDEPENDENT_AMBULATORY_CARE_PROVIDER_SITE_OTHER): Payer: Medicare Other | Admitting: Primary Care

## 2024-04-09 ENCOUNTER — Encounter: Payer: Self-pay | Admitting: Primary Care

## 2024-04-09 VITALS — BP 116/66 | HR 71 | Temp 97.2°F | Ht 69.0 in | Wt 180.0 lb

## 2024-04-09 DIAGNOSIS — E1165 Type 2 diabetes mellitus with hyperglycemia: Secondary | ICD-10-CM

## 2024-04-09 LAB — POCT GLYCOSYLATED HEMOGLOBIN (HGB A1C): Hemoglobin A1C: 5.6 % (ref 4.0–5.6)

## 2024-04-09 NOTE — Progress Notes (Signed)
 Subjective:    Patient ID: David Turner, male    DOB: 09-10-1951, 73 y.o.   MRN: [id number]  HPI  David Turner is a very pleasant 72 y.o. male with a history of hypertension, type 2 diabetes, gout, hyperlipidemia who presents today for follow-up of diabetes.  Current medications include: Metformin  ER 500 mg daily. He denies GI upset, nausea. He would like to come off of Metformin  if possible.   He is checking his blood glucose 0 times daily. He does not have a glucometer.    Last A1C: 6.4 in January 2025, Last Eye Exam: Up-to-date Last Foot Exam: Up-to-date Pneumonia Vaccination: 2020 Urine Microalbumin: Due Statin: Atorvastatin   Dietary changes since last visit: Eating home cooked meals and fast food. Little water.   Exercise: Active around the house.   BP Readings from Last 3 Encounters:  04/09/24 116/66  03/18/24 (!) 161/81  11/23/23 (!) 161/80      Review of Systems  Eyes:  Negative for visual disturbance.  Respiratory:  Negative for shortness of breath.   Cardiovascular:  Negative for chest pain.  Neurological:  Negative for numbness.         Past Medical History:  Diagnosis Date   Diabetes mellitus without complication (HCC)    Ear fullness, bilateral 09/29/2021   Gout    Hyperlipidemia    Hypertension    Wears dentures    partials, upper and lower    Social History   Socioeconomic History   Marital status: Married    Spouse name: Not on file   Number of children: Not on file   Years of education: Not on file   Highest education level: Bachelor's degree (e.g., BA, AB, BS)  Occupational History   Not on file  Tobacco Use   Smoking status: Former    Current packs/day: 0.00    Types: Cigarettes    Quit date: 2002    Years since quitting: 23.5    Passive exposure: Past   Smokeless tobacco: Never   Tobacco comments:    quit 2002  Vaping Use   Vaping status: Never Used  Substance and Sexual Activity   Alcohol use: Yes    Alcohol/week:  0.0 standard drinks of alcohol    Comment: 6-8 beers   Drug use: Never   Sexual activity: Yes  Other Topics Concern   Not on file  Social History Narrative   Married.   Travels to and from [place]  often- son still lives there.   Social Drivers of Corporate investment banker Strain: Low Risk  (03/11/2024)   Overall Financial Resource Strain (CARDIA)    Difficulty of Paying Living Expenses: Not hard at all  Food Insecurity: No Food Insecurity (03/11/2024)   Hunger Vital Sign    Worried About Running Out of Food in the Last Year: Never true    Ran Out of Food in the Last Year: Never true  Transportation Needs: No Transportation Needs (03/11/2024)   PRAPARE - Administrator, Civil Service (Medical): No    Lack of Transportation (Non-Medical): No  Physical Activity: Insufficiently Active (03/11/2024)   Exercise Vital Sign    Days of Exercise per Week: 1 day    Minutes of Exercise per Session: 10 min  Stress: No Stress Concern Present (03/11/2024)   [employer] of Occupational Health - Occupational Stress Questionnaire    Feeling of Stress: Not at all  Social Connections: Moderately Isolated (03/11/2024)   Social Connection and Isolation Panel  Frequency of Communication with Friends and Family: Three times a week    Frequency of Social Gatherings with Friends and Family: Three times a week    Attends Religious Services: Never    Active Member of Clubs or Organizations: No    Attends Banker Meetings: Never    Marital Status: Married  Catering manager Violence: Not At Risk (03/18/2024)   Humiliation, Afraid, Rape, and Kick questionnaire    Fear of Current or Ex-Partner: No    Emotionally Abused: No    Physically Abused: No    Sexually Abused: No    Past Surgical History:  Procedure Laterality Date   CATARACT EXTRACTION W/PHACO Left 02/19/2019   Procedure: CATARACT EXTRACTION PHACO AND INTRAOCULAR LENS PLACEMENT (IOC)  LEFT;  Surgeon: Mittie Gaskin,  MD;  Location: [facility];  Service: Ophthalmology;  Laterality: Left;   COLONOSCOPY WITH PROPOFOL  N/A 10/28/2020   Procedure: COLONOSCOPY WITH PROPOFOL ;  Surgeon: Janalyn Keene NOVAK, MD;  Location: [facility] ENDOSCOPY;  Service: Endoscopy;  Laterality: N/A;   COLONOSCOPY WITH PROPOFOL  N/A 11/04/2021   Procedure: COLONOSCOPY WITH PROPOFOL ;  Surgeon: Therisa Bi, MD;  Location: [facility] END[facility];  Service: Gastroenterology;  Laterality: N/A;   EYE SURGERY     JOINT REPLACEMENT     left knee surgeries  1983 1993, 2008   TOTAL HIP ARTHROPLASTY Left 05/19/2020    Family History  Problem Relation Age of Onset   Heart disease Father    Cancer Sister 46       colon   Heart disease Brother     No Known Allergies  Current Outpatient Medications on File Prior to Visit  Medication Sig Dispense Refill   allopurinol  (ZYLOPRIM ) 300 MG tablet TAKE 1 TABLET(300 MG) BY MOUTH DAILY FOR GOUT PREVENTION 90 tablet 1   aspirin 81 MG tablet Take 81 mg by mouth daily.     atorvastatin  (LIPITOR) 10 MG tablet Take 1 tablet (10 mg total) by mouth daily. for cholesterol. 90 tablet 1   Garlic 1000 MG CAPS Take 2 capsules by mouth.     Multiple Vitamin (MULTIVITAMIN) tablet Take 1 tablet by mouth daily.     Omega-3 Fatty Acids (FISH OIL) 1000 MG CAPS Take by mouth.     tamsulosin  (FLOMAX ) 0.4 MG CAPS capsule Take 2 capsules (0.8 mg total) by mouth daily. For urine flow 180 capsule 2   timolol  (BETIMOL ) 0.5 % ophthalmic solution 1 drop at bedtime.     valsartan -hydrochlorothiazide  (DIOVAN -HCT) 160-12.5 MG tablet Take 1 tablet by mouth daily. For blood pressure. 90 tablet 2   No current facility-administered medications on file prior to visit.    BP 116/66   Pulse 71   Temp (!) 97.2 F (36.2 C) (Temporal)   Ht 5' 9 (1.753 m)   Wt 180 lb (81.6 kg)   SpO2 96%   BMI 26.58 kg/m  Objective:   Physical Exam Cardiovascular:     Rate and Rhythm: Normal rate and regular rhythm.  Pulmonary:     Effort:  Pulmonary effort is normal.     Breath sounds: Normal breath sounds.  Musculoskeletal:     Cervical back: Neck supple.  Skin:    General: Skin is warm and dry.  Neurological:     Mental Status: He is alert and oriented to person, place, and time.  Psychiatric:        Mood and Affect: Mood normal.           Assessment & Plan:  Type 2 diabetes mellitus with hyperglycemia, without long-term current use of insulin (HCC) Assessment & Plan: Improved with A1C of 5.6!  Agree to discontinue metformin  to see how he does without treatment. I offered a glucometer kit prescription for which he kindly declines.  Urine microalbumin due and pending.  We will follow back up with him in 6 months.  Orders: -     POCT glycosylated hemoglobin (Hb A1C) -     Microalbumin / creatinine urine ratio        Comer MARLA Gaskins, NP

## 2024-04-09 NOTE — Assessment & Plan Note (Signed)
 Improved with A1C of 5.6!  Agree to discontinue metformin  to see how he does without treatment. I offered a glucometer kit prescription for which he kindly declines.  Urine microalbumin due and pending.  We will follow back up with him in 6 months.

## 2024-04-09 NOTE — Patient Instructions (Signed)
 Stop taking metformin  medication for diabetes.  It is important that you improve your diet. Please limit carbohydrates in the form of white bread, rice, pasta, sweets, fast food, fried food, sugary drinks, etc. Increase your consumption of fresh fruits and vegetables, whole grains, lean protein.  Ensure you are consuming 64 ounces of water daily.  You should be getting 150 minutes of moderate intensity exercise weekly.  Please schedule a follow up visit for 6 months for your annual visit.  It was a pleasure to see you today!

## 2024-04-10 ENCOUNTER — Ambulatory Visit: Payer: Self-pay | Admitting: Primary Care

## 2024-04-10 LAB — MICROALBUMIN / CREATININE URINE RATIO
Creatinine,U: 170.1 mg/dL
Microalb Creat Ratio: 4.8 mg/g (ref 0.0–30.0)
Microalb, Ur: 0.8 mg/dL (ref 0.0–1.9)

## 2024-05-14 ENCOUNTER — Other Ambulatory Visit: Payer: Self-pay | Admitting: Primary Care

## 2024-05-14 DIAGNOSIS — E119 Type 2 diabetes mellitus without complications: Secondary | ICD-10-CM

## 2024-06-09 ENCOUNTER — Other Ambulatory Visit: Payer: Self-pay

## 2024-06-09 DIAGNOSIS — M109 Gout, unspecified: Secondary | ICD-10-CM

## 2024-06-09 MED ORDER — ALLOPURINOL 300 MG PO TABS: ORAL_TABLET | ORAL | 1 refills | Status: AC

## 2024-06-30 ENCOUNTER — Other Ambulatory Visit: Payer: Self-pay

## 2024-06-30 DIAGNOSIS — E785 Hyperlipidemia, unspecified: Secondary | ICD-10-CM

## 2024-06-30 MED ORDER — ATORVASTATIN CALCIUM 10 MG PO TABS: 10.0000 mg | ORAL_TABLET | Freq: Every day | ORAL | 0 refills | Status: DC

## 2024-07-10 ENCOUNTER — Other Ambulatory Visit

## 2024-07-14 ENCOUNTER — Encounter: Payer: Self-pay | Admitting: Urology

## 2024-07-15 ENCOUNTER — Telehealth: Payer: Self-pay | Admitting: Primary Care

## 2024-07-15 ENCOUNTER — Other Ambulatory Visit: Payer: Self-pay | Admitting: Primary Care

## 2024-07-15 NOTE — Telephone Encounter (Signed)
 Called lvm for patient to call back about lab appointment to speak with lab. Left number [phone] for patient to call back

## 2024-07-16 ENCOUNTER — Other Ambulatory Visit: Payer: Self-pay | Admitting: Primary Care

## 2024-07-16 DIAGNOSIS — E1165 Type 2 diabetes mellitus with hyperglycemia: Secondary | ICD-10-CM

## 2024-07-21 ENCOUNTER — Ambulatory Visit: Payer: Self-pay | Admitting: Primary Care

## 2024-07-21 ENCOUNTER — Other Ambulatory Visit (INDEPENDENT_AMBULATORY_CARE_PROVIDER_SITE_OTHER)

## 2024-07-21 DIAGNOSIS — E1165 Type 2 diabetes mellitus with hyperglycemia: Secondary | ICD-10-CM

## 2024-07-21 LAB — MICROALBUMIN / CREATININE URINE RATIO
Creatinine,U: 55.2 mg/dL
Microalb Creat Ratio: UNDETERMINED mg/g (ref 0.0–30.0)
Microalb, Ur: <0.7 mg/dL

## 2024-07-21 LAB — POCT GLYCOSYLATED HEMOGLOBIN (HGB A1C): Hemoglobin A1C: 6.2 % — AB (ref 4.0–5.6)

## 2024-08-05 DIAGNOSIS — R3912 Poor urinary stream: Secondary | ICD-10-CM

## 2024-08-06 MED ORDER — TAMSULOSIN HCL 0.4 MG PO CAPS: 0.8000 mg | ORAL_CAPSULE | Freq: Every day | ORAL | 0 refills | Status: AC

## 2024-08-28 ENCOUNTER — Other Ambulatory Visit

## 2024-08-28 DIAGNOSIS — R972 Elevated prostate specific antigen [PSA]: Secondary | ICD-10-CM

## 2024-08-29 LAB — PSA: Prostate Specific Ag, Serum: 3.6 ng/mL (ref 0.0–4.0)

## 2024-09-01 ENCOUNTER — Ambulatory Visit: Admitting: Urology

## 2024-09-09 ENCOUNTER — Ambulatory Visit (INDEPENDENT_AMBULATORY_CARE_PROVIDER_SITE_OTHER): Admitting: Urology

## 2024-09-09 ENCOUNTER — Encounter: Payer: Self-pay | Admitting: Urology

## 2024-09-09 VITALS — BP 159/80 | HR 80 | Ht 69.0 in | Wt 189.0 lb

## 2024-09-09 DIAGNOSIS — C61 Malignant neoplasm of prostate: Secondary | ICD-10-CM

## 2024-09-09 NOTE — Progress Notes (Unsigned)
 "  09/09/2024 10:17 AM   Alm Search Jun 03, 1951 [id number]  Referring provider: Gretta Comer POUR, NP [address] [place],  [place] [address]  Chief Complaint  Patient presents with   Prostate Cancer   Urologic history 1. T1c favorable intermediate risk prostate cancer PSA 10/10/22 was 6.45 which was persistently elevated on repeat at 5.96  Prostate MRI 03/16/23 showed a 50 gram prostate volume with a PIRADS 4 lesion left anterior pz. MR Fusion biopsy 05/02/23 with ROI cores showing benign prostate tissue and 2 cores right prostate showing Gleason 3+4 adenocarcinoma involving 1% of submitted tissue each core and Gleason 3+3 adenocarcinoma involving 1% at left lateral apex  GPS low at 6 Elected active surveillance.  HPI: David Turner is a 73 y.o. male who presents for 37-month follow-up  No complaints since last visit. PSA 08/28/2024 was 3.6 Denies dysuria, gross hematuria Denies flank, abdominal, or pelvic pain.   PSA trend ***   Prostate Specific Ag, Serum  Latest Ref Rng 0.0 - 4.0 ng/mL                      PMH: Past Medical History:  Diagnosis Date   Diabetes mellitus without complication (HCC)    Ear fullness, bilateral 09/29/2021   Gout    Hyperlipidemia    Hypertension    Wears dentures    partials, upper and lower    Surgical History: Past Surgical History:  Procedure Laterality Date   CATARACT EXTRACTION W/PHACO Left 02/19/2019   Procedure: CATARACT EXTRACTION PHACO AND INTRAOCULAR LENS PLACEMENT (IOC)  LEFT;  Surgeon: Mittie Gaskin, MD;  Location: [facility];  Service: Ophthalmology;  Laterality: Left;   COLONOSCOPY WITH PROPOFOL  N/A 10/28/2020   Procedure: COLONOSCOPY WITH PROPOFOL ;  Surgeon: Janalyn Keene NOVAK, MD;  Location: [facility];  Service: Endoscopy;  Laterality: N/A;   COLONOSCOPY WITH PROPOFOL  N/A 11/04/2021   Procedure: COLONOSCOPY WITH PROPOFOL ;  Surgeon: Therisa Bi, MD;  Location: [facility] END[facility];  Service:  Gastroenterology;  Laterality: N/A;   EYE SURGERY     JOINT REPLACEMENT     left knee surgeries  1983 1993, 2008   TOTAL HIP ARTHROPLASTY Left 05/19/2020    Home Medications:  Allergies as of 09/09/2024   No Known Allergies      Medication List        Accurate as of September 09, 2024 10:17 AM. If you have any questions, ask your nurse or doctor.          allopurinol  300 MG tablet Commonly known as: ZYLOPRIM  TAKE 1 TABLET(300 MG) BY MOUTH DAILY FOR GOUT PREVENTION   aspirin 81 MG tablet Take 81 mg by mouth daily.   atorvastatin  10 MG tablet Commonly known as: LIPITOR Take 1 tablet (10 mg total) by mouth daily. for cholesterol.   Fish Oil 1000 MG Caps Take by mouth.   Garlic 1000 MG Caps Take 2 capsules by mouth.   multivitamin tablet Take 1 tablet by mouth daily.   tamsulosin  0.4 MG Caps capsule Commonly known as: FLOMAX  Take 2 capsules (0.8 mg total) by mouth daily. For urine flow   timolol  0.5 % ophthalmic solution Commonly known as: TIMOPTIC  1 drop daily.   valsartan -hydrochlorothiazide  160-12.5 MG tablet Commonly known as: DIOVAN -HCT Take 1 tablet by mouth daily. For blood pressure.        Allergies: Allergies[1]  Family History: Family History  Problem Relation Age of Onset   Heart disease Father    Cancer Sister  60       colon   Heart disease Brother     Social History:  reports that he quit smoking about 24 years ago. His smoking use included cigarettes. He has been exposed to tobacco smoke. He has never used smokeless tobacco. He reports current alcohol use. He reports that he does not use drugs.   Physical Exam: BP (!) 159/80 (BP Location: Left Arm, Patient Position: Sitting, Cuff Size: Normal)   Pulse 80   Ht 5' 9 (1.753 m)   Wt 189 lb (85.7 kg)   SpO2 99%   BMI 27.91 kg/m   Constitutional:  Alert, No acute distress. HEENT: [place] AT Respiratory: Normal respiratory effort, no increased work of breathing. GU: Prostate 50 g,  smooth without nodules Psychiatric: Normal mood and affect.   Assessment & Plan:    T1c favorable intermediate risk prostate cancer Most recent PSA normal 3.6; benign DRE Desires to continue active surveillance Lab for PSA 6 months After PSA in June will schedule follow-up prostate MRI and then discuss confirmatory biopsy   Glendia JAYSON Barba, MD  [facility] [address], [address] [place], [place] [address] [phone]     [1] No Known Allergies  "

## 2024-09-25 ENCOUNTER — Other Ambulatory Visit: Payer: Self-pay | Admitting: *Deleted

## 2024-09-25 DIAGNOSIS — I10 Essential (primary) hypertension: Secondary | ICD-10-CM

## 2024-09-25 MED ORDER — VALSARTAN-HYDROCHLOROTHIAZIDE 160-12.5 MG PO TABS: 1.0000 | ORAL_TABLET | Freq: Every day | ORAL | 0 refills | Status: AC

## 2024-10-10 ENCOUNTER — Encounter: Payer: Self-pay | Admitting: Primary Care

## 2024-10-10 ENCOUNTER — Ambulatory Visit: Admitting: Primary Care

## 2024-10-10 ENCOUNTER — Ambulatory Visit: Payer: Self-pay | Admitting: Primary Care

## 2024-10-10 VITALS — BP 128/60 | HR 65 | Temp 98.0°F | Ht 66.5 in | Wt 188.4 lb

## 2024-10-10 DIAGNOSIS — Z1211 Encounter for screening for malignant neoplasm of colon: Secondary | ICD-10-CM

## 2024-10-10 DIAGNOSIS — R972 Elevated prostate specific antigen [PSA]: Secondary | ICD-10-CM

## 2024-10-10 DIAGNOSIS — Z800 Family history of malignant neoplasm of digestive organs: Secondary | ICD-10-CM

## 2024-10-10 DIAGNOSIS — E1165 Type 2 diabetes mellitus with hyperglycemia: Secondary | ICD-10-CM

## 2024-10-10 DIAGNOSIS — E785 Hyperlipidemia, unspecified: Secondary | ICD-10-CM

## 2024-10-10 DIAGNOSIS — I10 Essential (primary) hypertension: Secondary | ICD-10-CM | POA: Diagnosis not present

## 2024-10-10 LAB — COMPREHENSIVE METABOLIC PANEL WITH GFR
ALT: 20 U/L (ref 3–53)
AST: 22 U/L (ref 5–37)
Albumin: 4.3 g/dL (ref 3.5–5.2)
Alkaline Phosphatase: 83 U/L (ref 39–117)
BUN: 12 mg/dL (ref 6–23)
CO2: 30 meq/L (ref 19–32)
Calcium: 9.5 mg/dL (ref 8.4–10.5)
Chloride: 103 meq/L (ref 96–112)
Creatinine, Ser: 0.79 mg/dL (ref 0.40–1.50)
GFR: 88.18 mL/min
Glucose, Bld: 112 mg/dL — ABNORMAL HIGH (ref 70–99)
Potassium: 4.4 meq/L (ref 3.5–5.1)
Sodium: 140 meq/L (ref 135–145)
Total Bilirubin: 0.6 mg/dL (ref 0.2–1.2)
Total Protein: 6.8 g/dL (ref 6.0–8.3)

## 2024-10-10 LAB — CBC
HCT: 44.2 % (ref 39.0–52.0)
Hemoglobin: 14.7 g/dL (ref 13.0–17.0)
MCHC: 33.3 g/dL (ref 30.0–36.0)
MCV: 87.1 fl (ref 78.0–100.0)
Platelets: 218 10*3/uL (ref 150.0–400.0)
RBC: 5.07 Mil/uL (ref 4.22–5.81)
RDW: 15 % (ref 11.5–15.5)
WBC: 5.6 10*3/uL (ref 4.0–10.5)

## 2024-10-10 LAB — LIPID PANEL
Cholesterol: 137 mg/dL (ref 28–200)
HDL: 62.8 mg/dL
LDL Cholesterol: 66 mg/dL (ref 10–99)
NonHDL: 73.77
Total CHOL/HDL Ratio: 2
Triglycerides: 41 mg/dL (ref 10.0–149.0)
VLDL: 8.2 mg/dL (ref 0.0–40.0)

## 2024-10-10 LAB — HEMOGLOBIN A1C: Hgb A1c MFr Bld: 6.4 % (ref 4.6–6.5)

## 2024-10-10 NOTE — Assessment & Plan Note (Signed)
 Repeat lipid panel pending.  Continue atorvastatin 10 mg daily.

## 2024-10-10 NOTE — Assessment & Plan Note (Signed)
 Colonoscopy due in February 2026, referral placed to GI.

## 2024-10-10 NOTE — Assessment & Plan Note (Signed)
 Controlled.  Continue valsartan-hydrochlorothiazide 160-12.5 mg daily. CMP pending.

## 2024-10-10 NOTE — Progress Notes (Signed)
 "  Subjective:    Patient ID: David Turner, male    DOB: 1951/07/09, 74 y.o.   MRN: [id number]  David Turner is a very pleasant 74 y.o. male with a history of hypertension, type 2 diabetes, hyperlipidemia, elevated PSA who presents today for follow-up of chronic conditions  1) Type 2 Diabetes:  Current medications include: None  He is checking his blood glucose 0 times daily.  Last A1C: 6.2 in November 2025 Last Eye Exam: Up-to-date Last Foot Exam: Due Pneumonia Vaccination: Up-to-date Urine Microalbumin: Up-to-date Statin: Atorvastatin   2) Hyperlipidemia: Currently managed on atorvastatin  10 mg daily.  He is due for repeat lipid panel today.  3) Hypertension: Currently managed on valsartan -hydrochlorothiazide  60-12.5 mg daily.  He denies chest pain, dizziness, headaches.  BP Readings from Last 3 Encounters:  10/10/24 128/60  09/09/24 (!) 159/80  04/09/24 116/66   He is due for colonoscopy February 2026.     Review of Systems  Respiratory:  Negative for shortness of breath.   Cardiovascular:  Negative for chest pain.  Gastrointestinal:  Negative for constipation and diarrhea.  Neurological:  Negative for dizziness and headaches.         Past Medical History:  Diagnosis Date   Diabetes mellitus without complication (HCC)    Ear fullness, bilateral 09/29/2021   Gout    Hyperlipidemia    Hypertension    Wears dentures    partials, upper and lower    Social History   Socioeconomic History   Marital status: Married    Spouse name: Not on file   Number of children: Not on file   Years of education: Not on file   Highest education level: Bachelor's degree (e.g., BA, AB, BS)  Occupational History   Not on file  Tobacco Use   Smoking status: Former    Current packs/day: 0.00    Types: Cigarettes    Quit date: 2002    Years since quitting: 24.0    Passive exposure: Past   Smokeless tobacco: Never   Tobacco comments:    quit 2002  Vaping Use   Vaping  status: Never Used  Substance and Sexual Activity   Alcohol use: Yes    Comment: 6-8 beers   Drug use: Never   Sexual activity: Yes  Other Topics Concern   Not on file  Social History Narrative   Married.   Travels to and from [place]  often- son still lives there.   Social Drivers of Health   Tobacco Use: Medium Risk (10/10/2024)   Patient History    Smoking Tobacco Use: Former    Smokeless Tobacco Use: Never    Passive Exposure: Past  Physicist, Medical Strain: Low Risk (10/08/2024)   Overall Financial Resource Strain (CARDIA)    Difficulty of Paying Living Expenses: Not hard at all  Food Insecurity: No Food Insecurity (10/08/2024)   Epic    Worried About Programme Researcher, Broadcasting/film/video in the Last Year: Never true    Ran Out of Food in the Last Year: Never true  Transportation Needs: No Transportation Needs (10/08/2024)   Epic    Lack of Transportation (Medical): No    Lack of Transportation (Non-Medical): No  Physical Activity: Inactive (10/08/2024)   Exercise Vital Sign    Days of Exercise per Week: 0 days    Minutes of Exercise per Session: Not on file  Stress: No Stress Concern Present (10/08/2024)   [employer] of Occupational Health - Occupational Stress Questionnaire    Feeling of  Stress: Not at all  Social Connections: Moderately Isolated (10/08/2024)   Social Connection and Isolation Panel    Frequency of Communication with Friends and Family: Three times a week    Frequency of Social Gatherings with Friends and Family: More than three times a week    Attends Religious Services: Never    Database Administrator or Organizations: No    Attends Banker Meetings: Not on file    Marital Status: Married  Intimate Partner Violence: Not At Risk (03/18/2024)   Epic    Fear of Current or Ex-Partner: No    Emotionally Abused: No    Physically Abused: No    Sexually Abused: No  Depression (PHQ2-9): Low Risk (10/10/2024)   Depression (PHQ2-9)    PHQ-2 Score: 0  Alcohol  Screen: Medium Risk (10/08/2024)   Alcohol Screen    Last Alcohol Screening Score (AUDIT): 9  Housing: Low Risk (10/08/2024)   Epic    Unable to Pay for Housing in the Last Year: No    Number of Times Moved in the Last Year: 1    Homeless in the Last Year: No  Utilities: Not At Risk (03/18/2024)   Epic    Threatened with loss of utilities: No  Health Literacy: Adequate Health Literacy (03/18/2024)   B1300 Health Literacy    Frequency of need for help with medical instructions: Never    Past Surgical History:  Procedure Laterality Date   CATARACT EXTRACTION W/PHACO Left 02/19/2019   Procedure: CATARACT EXTRACTION PHACO AND INTRAOCULAR LENS PLACEMENT (IOC)  LEFT;  Surgeon: Mittie Gaskin, MD;  Location: [facility];  Service: Ophthalmology;  Laterality: Left;   COLONOSCOPY WITH PROPOFOL  N/A 10/28/2020   Procedure: COLONOSCOPY WITH PROPOFOL ;  Surgeon: Janalyn Keene NOVAK, MD;  Location: [facility];  Service: Endoscopy;  Laterality: N/A;   COLONOSCOPY WITH PROPOFOL  N/A 11/04/2021   Procedure: COLONOSCOPY WITH PROPOFOL ;  Surgeon: Therisa Bi, MD;  Location: [facility];  Service: Gastroenterology;  Laterality: N/A;   EYE SURGERY     JOINT REPLACEMENT     left knee surgeries  1983 1993, 2008   TOTAL HIP ARTHROPLASTY Left 05/19/2020    Family History  Problem Relation Age of Onset   Heart disease Father    Cancer Sister 70       colon   Heart disease Brother     Allergies[1]  Medications Ordered Prior to Encounter[2]  BP 128/60   Pulse 65   Temp 98 F (36.7 C) (Oral)   Ht 5' 6.5 (1.689 m)   Wt 188 lb 6.4 oz (85.5 kg)   SpO2 98%   BMI 29.95 kg/m  Objective:   Physical Exam Cardiovascular:     Rate and Rhythm: Normal rate and regular rhythm.  Pulmonary:     Effort: Pulmonary effort is normal.     Breath sounds: Normal breath sounds.  Abdominal:     General: Bowel sounds are normal.     Palpations: Abdomen is soft.     Tenderness: There is no  abdominal tenderness.  Musculoskeletal:     Cervical back: Neck supple.  Skin:    General: Skin is warm and dry.  Neurological:     Mental Status: He is alert and oriented to person, place, and time.  Psychiatric:        Mood and Affect: Mood normal.     Physical Exam        Assessment & Plan:  Primary hypertension Assessment & Plan:  Controlled.  Continue valsartan -hydrochlorothiazide  160-12.5 mg daily.  CMP pending.  Orders: -     Lipid panel -     Comprehensive metabolic panel with GFR -     CBC  Type 2 diabetes mellitus with hyperglycemia, without long-term current use of insulin (HCC) Assessment & Plan: Repeat A1c pending.  Remain off treatment.  Follow-up in 3 to 6 months based on A1c result  Orders: -     Hemoglobin A1c  Hyperlipidemia, unspecified hyperlipidemia type Assessment & Plan: Repeat lipid panel pending. Continue atorvastatin  10 mg daily.  Orders: -     Lipid panel -     Comprehensive metabolic panel with GFR  Family history of colon cancer Assessment & Plan: Colonoscopy due in February 2026, referral placed to GI.   Elevated PSA Assessment & Plan: Following with urology, office notes reviewed from December 2025.  Continue tamsulosin  0.8 mg daily.   Screening for colon cancer -     Ambulatory referral to Gastroenterology    Assessment and Plan Assessment & Plan         Comer MARLA Gaskins, NP       [1] No Known Allergies [2]  Current Outpatient Medications on File Prior to Visit  Medication Sig Dispense Refill   allopurinol  (ZYLOPRIM ) 300 MG tablet TAKE 1 TABLET(300 MG) BY MOUTH DAILY FOR GOUT PREVENTION 90 tablet 1   aspirin 81 MG tablet Take 81 mg by mouth daily.     atorvastatin  (LIPITOR) 10 MG tablet Take 1 tablet (10 mg total) by mouth daily. for cholesterol. 90 tablet 0   Garlic 1000 MG CAPS Take 2 capsules by mouth.     Multiple Vitamin (MULTIVITAMIN) tablet Take 1 tablet by mouth daily.     Omega-3  Fatty Acids (FISH OIL) 1000 MG CAPS Take by mouth.     tamsulosin  (FLOMAX ) 0.4 MG CAPS capsule Take 2 capsules (0.8 mg total) by mouth daily. For urine flow 180 capsule 0   timolol  (TIMOPTIC ) 0.5 % ophthalmic solution 1 drop daily.     valsartan -hydrochlorothiazide  (DIOVAN -HCT) 160-12.5 MG tablet Take 1 tablet by mouth daily. For blood pressure. 90 tablet 0   No current facility-administered medications on file prior to visit.   "

## 2024-10-10 NOTE — Assessment & Plan Note (Addendum)
 Following with urology, office notes reviewed from December 2025.  Continue tamsulosin  0.8 mg daily.

## 2024-10-10 NOTE — Patient Instructions (Signed)
 You will either be contacted via phone regarding your referral to GI, or you may receive a letter on your MyChart portal from our referral team with instructions for scheduling an appointment. Please let us  know if you have not been contacted by anyone within two weeks.  Stop by the lab prior to leaving today. I will notify you of your results once received.   Please schedule a follow up visit for 6 months for a diabetes check.  It was a pleasure to see you today!

## 2024-10-12 ENCOUNTER — Other Ambulatory Visit: Payer: Self-pay | Admitting: Primary Care

## 2024-10-12 DIAGNOSIS — E785 Hyperlipidemia, unspecified: Secondary | ICD-10-CM

## 2025-03-09 ENCOUNTER — Other Ambulatory Visit

## 2025-03-19 ENCOUNTER — Ambulatory Visit

## 2025-03-20 ENCOUNTER — Ambulatory Visit
